# Patient Record
Sex: Male | Born: 1960
Health system: Southern US, Community
[De-identification: ages and names within clinical notes are randomized; demographics above are authoritative.]

## PROBLEM LIST (undated history)

## (undated) DIAGNOSIS — C4491 Basal cell carcinoma of skin, unspecified: Secondary | ICD-10-CM

## (undated) DIAGNOSIS — M1990 Unspecified osteoarthritis, unspecified site: Secondary | ICD-10-CM

## (undated) DIAGNOSIS — M7542 Impingement syndrome of left shoulder: Secondary | ICD-10-CM

## (undated) DIAGNOSIS — I447 Left bundle-branch block, unspecified: Secondary | ICD-10-CM

## (undated) DIAGNOSIS — S46019A Strain of muscle(s) and tendon(s) of the rotator cuff of unspecified shoulder, initial encounter: Secondary | ICD-10-CM

## (undated) HISTORY — PX: JOINT REPLACEMENT: SHX530

## (undated) HISTORY — PX: LASIK: SHX215

## (undated) HISTORY — PX: TONSILLECTOMY: SUR1361

## (undated) HISTORY — PX: WISDOM TOOTH EXTRACTION: SHX21

---

## 1990-03-17 HISTORY — PX: SHOULDER OPEN ROTATOR CUFF REPAIR: SHX2407

## 1999-08-08 ENCOUNTER — Encounter: Payer: Self-pay | Admitting: Emergency Medicine

## 1999-08-08 ENCOUNTER — Emergency Department (HOSPITAL_COMMUNITY): Admission: EM | Admit: 1999-08-08 | Discharge: 1999-08-08 | Payer: Self-pay | Admitting: Emergency Medicine

## 2001-08-16 ENCOUNTER — Ambulatory Visit (HOSPITAL_COMMUNITY): Admission: RE | Admit: 2001-08-16 | Discharge: 2001-08-16 | Payer: Self-pay | Admitting: Urology

## 2001-08-16 ENCOUNTER — Encounter: Payer: Self-pay | Admitting: Urology

## 2001-09-14 ENCOUNTER — Ambulatory Visit (HOSPITAL_BASED_OUTPATIENT_CLINIC_OR_DEPARTMENT_OTHER): Admission: RE | Admit: 2001-09-14 | Discharge: 2001-09-14 | Payer: Self-pay | Admitting: Urology

## 2001-09-14 HISTORY — PX: VARICOCELECTOMY: SHX1084

## 2002-02-24 ENCOUNTER — Encounter: Admission: RE | Admit: 2002-02-24 | Discharge: 2002-02-24 | Payer: Self-pay | Admitting: Internal Medicine

## 2002-02-24 ENCOUNTER — Encounter: Payer: Self-pay | Admitting: Internal Medicine

## 2003-03-29 ENCOUNTER — Ambulatory Visit (HOSPITAL_COMMUNITY): Admission: RE | Admit: 2003-03-29 | Discharge: 2003-03-29 | Payer: Self-pay | Admitting: General Surgery

## 2003-03-29 ENCOUNTER — Encounter (INDEPENDENT_AMBULATORY_CARE_PROVIDER_SITE_OTHER): Payer: Self-pay | Admitting: *Deleted

## 2003-03-29 ENCOUNTER — Ambulatory Visit (HOSPITAL_BASED_OUTPATIENT_CLINIC_OR_DEPARTMENT_OTHER): Admission: RE | Admit: 2003-03-29 | Discharge: 2003-03-29 | Payer: Self-pay | Admitting: General Surgery

## 2006-05-22 ENCOUNTER — Ambulatory Visit: Payer: Self-pay | Admitting: Family Medicine

## 2006-05-22 LAB — CONVERTED CEMR LAB
ALT: 18 units/L (ref 0–40)
AST: 22 units/L (ref 0–37)
Albumin: 3.7 g/dL (ref 3.5–5.2)
Alkaline Phosphatase: 67 units/L (ref 39–117)
BUN: 10 mg/dL (ref 6–23)
Basophils Absolute: 0 10*3/uL (ref 0.0–0.1)
Basophils Relative: 0.2 % (ref 0.0–1.0)
Bilirubin, Direct: 0.2 mg/dL (ref 0.0–0.3)
CO2: 28 meq/L (ref 19–32)
Calcium: 9.4 mg/dL (ref 8.4–10.5)
Chloride: 102 meq/L (ref 96–112)
Cholesterol: 134 mg/dL (ref 0–200)
Creatinine, Ser: 0.9 mg/dL (ref 0.4–1.5)
Eosinophils Absolute: 0 10*3/uL (ref 0.0–0.6)
Eosinophils Relative: 0.3 % (ref 0.0–5.0)
GFR calc Af Amer: 118 mL/min
GFR calc non Af Amer: 98 mL/min
Glucose, Bld: 104 mg/dL — ABNORMAL HIGH (ref 70–99)
HCT: 44.3 % (ref 39.0–52.0)
HDL: 42.1 mg/dL (ref >39.0)
Hemoglobin: 15.1 g/dL (ref 13.0–17.0)
LDL Cholesterol: 78 mg/dL (ref 0–99)
Lymphocytes Relative: 18 % (ref 12.0–46.0)
MCHC: 34 g/dL (ref 30.0–36.0)
MCV: 89.6 fL (ref 78.0–100.0)
Monocytes Absolute: 0.6 10*3/uL (ref 0.2–0.7)
Monocytes Relative: 7.1 % (ref 3.0–11.0)
Neutro Abs: 6.9 10*3/uL (ref 1.4–7.7)
Neutrophils Relative %: 74.4 % (ref 43.0–77.0)
Platelets: 236 10*3/uL (ref 150–400)
Potassium: 4.3 meq/L (ref 3.5–5.1)
RBC: 4.94 M/uL (ref 4.22–5.81)
RDW: 13.1 % (ref 11.5–14.6)
Sodium: 138 meq/L (ref 135–145)
TSH: 1.03 microintl units/mL (ref 0.35–5.50)
Total Bilirubin: 2.2 mg/dL — ABNORMAL HIGH (ref 0.3–1.2)
Total CHOL/HDL Ratio: 3.2
Total Protein: 6.7 g/dL (ref 6.0–8.3)
Triglycerides: 71 mg/dL (ref 0–149)
VLDL: 14 mg/dL (ref 0–40)
WBC: 9.1 10*3/uL (ref 4.5–10.5)

## 2006-05-29 ENCOUNTER — Ambulatory Visit: Payer: Self-pay | Admitting: Family Medicine

## 2006-05-29 LAB — CONVERTED CEMR LAB: PSA: 1.05 ng/mL (ref 0.10–4.00)

## 2006-10-13 ENCOUNTER — Ambulatory Visit: Payer: Self-pay | Admitting: Family Medicine

## 2006-10-13 DIAGNOSIS — S96819A Strain of other specified muscles and tendons at ankle and foot level, unspecified foot, initial encounter: Secondary | ICD-10-CM

## 2006-10-13 DIAGNOSIS — A630 Anogenital (venereal) warts: Secondary | ICD-10-CM | POA: Insufficient documentation

## 2006-10-13 DIAGNOSIS — S93499A Sprain of other ligament of unspecified ankle, initial encounter: Secondary | ICD-10-CM | POA: Insufficient documentation

## 2006-10-14 ENCOUNTER — Telehealth (INDEPENDENT_AMBULATORY_CARE_PROVIDER_SITE_OTHER): Payer: Self-pay | Admitting: *Deleted

## 2007-01-12 ENCOUNTER — Ambulatory Visit: Payer: Self-pay | Admitting: Family Medicine

## 2007-01-12 DIAGNOSIS — S335XXA Sprain of ligaments of lumbar spine, initial encounter: Secondary | ICD-10-CM | POA: Insufficient documentation

## 2007-01-12 DIAGNOSIS — G4700 Insomnia, unspecified: Secondary | ICD-10-CM | POA: Insufficient documentation

## 2007-01-12 DIAGNOSIS — L723 Sebaceous cyst: Secondary | ICD-10-CM | POA: Insufficient documentation

## 2007-01-12 DIAGNOSIS — IMO0002 Reserved for concepts with insufficient information to code with codable children: Secondary | ICD-10-CM | POA: Insufficient documentation

## 2007-01-21 ENCOUNTER — Telehealth: Payer: Self-pay | Admitting: Family Medicine

## 2007-03-01 ENCOUNTER — Telehealth: Payer: Self-pay | Admitting: Family Medicine

## 2007-03-02 ENCOUNTER — Ambulatory Visit: Payer: Self-pay | Admitting: Family Medicine

## 2007-03-03 ENCOUNTER — Encounter: Admission: RE | Admit: 2007-03-03 | Discharge: 2007-03-03 | Payer: Self-pay | Admitting: Family Medicine

## 2007-03-09 ENCOUNTER — Encounter: Payer: Self-pay | Admitting: Family Medicine

## 2007-04-20 ENCOUNTER — Encounter: Payer: Self-pay | Admitting: Family Medicine

## 2008-01-21 ENCOUNTER — Telehealth: Payer: Self-pay | Admitting: Family Medicine

## 2008-04-18 ENCOUNTER — Telehealth: Payer: Self-pay | Admitting: Family Medicine

## 2008-04-19 ENCOUNTER — Ambulatory Visit: Payer: Self-pay | Admitting: Family Medicine

## 2008-04-19 LAB — CONVERTED CEMR LAB
Bilirubin Urine: NEGATIVE
Blood in Urine, dipstick: NEGATIVE
Glucose, Urine, Semiquant: NEGATIVE
Ketones, urine, test strip: NEGATIVE
Nitrite: NEGATIVE
Protein, U semiquant: NEGATIVE
Specific Gravity, Urine: 1.02
Urobilinogen, UA: 0.2
WBC Urine, dipstick: NEGATIVE
pH: 7

## 2008-04-24 LAB — CONVERTED CEMR LAB
ALT: 23 units/L (ref 0–53)
AST: 27 units/L (ref 0–37)
Albumin: 4 g/dL (ref 3.5–5.2)
Alkaline Phosphatase: 75 units/L (ref 39–117)
BUN: 18 mg/dL (ref 6–23)
Basophils Absolute: 0 10*3/uL (ref 0.0–0.1)
Basophils Relative: 0 % (ref 0.0–3.0)
Bilirubin, Direct: 0.2 mg/dL (ref 0.0–0.3)
CO2: 30 meq/L (ref 19–32)
Calcium: 9.4 mg/dL (ref 8.4–10.5)
Chloride: 107 meq/L (ref 96–112)
Cholesterol: 162 mg/dL (ref 0–200)
Creatinine, Ser: 1 mg/dL (ref 0.4–1.5)
Eosinophils Absolute: 0 10*3/uL (ref 0.0–0.7)
Eosinophils Relative: 0.9 % (ref 0.0–5.0)
GFR calc Af Amer: 104 mL/min
GFR calc non Af Amer: 86 mL/min
Glucose, Bld: 110 mg/dL — ABNORMAL HIGH (ref 70–99)
HCT: 45.4 % (ref 39.0–52.0)
HDL: 42 mg/dL (ref >39.0)
Hemoglobin: 15.3 g/dL (ref 13.0–17.0)
LDL Cholesterol: 109 mg/dL — ABNORMAL HIGH (ref 0–99)
Lymphocytes Relative: 29.6 % (ref 12.0–46.0)
MCHC: 33.8 g/dL (ref 30.0–36.0)
MCV: 90.9 fL (ref 78.0–100.0)
Monocytes Absolute: 0.5 10*3/uL (ref 0.1–1.0)
Monocytes Relative: 9.6 % (ref 3.0–12.0)
Neutro Abs: 2.9 10*3/uL (ref 1.4–7.7)
Neutrophils Relative %: 59.9 % (ref 43.0–77.0)
Platelets: 202 10*3/uL (ref 150–400)
Potassium: 4.9 meq/L (ref 3.5–5.1)
RBC: 4.99 M/uL (ref 4.22–5.81)
RDW: 13.2 % (ref 11.5–14.6)
Sodium: 141 meq/L (ref 135–145)
TSH: 0.35 microintl units/mL (ref 0.35–5.50)
Total Bilirubin: 1.3 mg/dL — ABNORMAL HIGH (ref 0.3–1.2)
Total CHOL/HDL Ratio: 3.9
Total Protein: 6.7 g/dL (ref 6.0–8.3)
Triglycerides: 53 mg/dL (ref 0–149)
VLDL: 11 mg/dL (ref 0–40)
WBC: 4.9 10*3/uL (ref 4.5–10.5)

## 2008-04-26 ENCOUNTER — Ambulatory Visit: Payer: Self-pay | Admitting: Family Medicine

## 2008-04-26 DIAGNOSIS — M545 Low back pain, unspecified: Secondary | ICD-10-CM | POA: Insufficient documentation

## 2008-05-01 LAB — CONVERTED CEMR LAB: PSA: 0.68 ng/mL (ref 0.10–4.00)

## 2008-06-27 ENCOUNTER — Telehealth: Payer: Self-pay | Admitting: Family Medicine

## 2008-09-26 ENCOUNTER — Telehealth: Payer: Self-pay | Admitting: Family Medicine

## 2009-02-13 ENCOUNTER — Telehealth: Payer: Self-pay | Admitting: Family Medicine

## 2009-02-14 ENCOUNTER — Telehealth: Payer: Self-pay | Admitting: Family Medicine

## 2009-05-15 ENCOUNTER — Ambulatory Visit: Payer: Self-pay | Admitting: Family Medicine

## 2009-05-15 LAB — CONVERTED CEMR LAB
Bilirubin Urine: NEGATIVE
Blood in Urine, dipstick: NEGATIVE
Glucose, Urine, Semiquant: NEGATIVE
Ketones, urine, test strip: NEGATIVE
Nitrite: NEGATIVE
Protein, U semiquant: NEGATIVE
Specific Gravity, Urine: 1.015
Urobilinogen, UA: 0.2
WBC Urine, dipstick: NEGATIVE
pH: 5.5

## 2009-05-17 LAB — CONVERTED CEMR LAB
ALT: 20 units/L (ref 0–53)
AST: 24 units/L (ref 0–37)
Albumin: 4 g/dL (ref 3.5–5.2)
Alkaline Phosphatase: 62 units/L (ref 39–117)
Basophils Relative: 0.4 % (ref 0.0–3.0)
CO2: 29 meq/L (ref 19–32)
Calcium: 8.9 mg/dL (ref 8.4–10.5)
Chloride: 107 meq/L (ref 96–112)
Eosinophils Absolute: 0 10*3/uL (ref 0.0–0.7)
Eosinophils Relative: 1 % (ref 0.0–5.0)
HDL: 46.1 mg/dL (ref >39.00)
Hemoglobin: 13.9 g/dL (ref 13.0–17.0)
Lymphocytes Relative: 33 % (ref 12.0–46.0)
MCHC: 33.8 g/dL (ref 30.0–36.0)
Monocytes Relative: 9.7 % (ref 3.0–12.0)
Neutro Abs: 2.7 10*3/uL (ref 1.4–7.7)
RBC: 4.49 M/uL (ref 4.22–5.81)
Sodium: 139 meq/L (ref 135–145)
Total CHOL/HDL Ratio: 3
Total Protein: 6.5 g/dL (ref 6.0–8.3)
WBC: 4.8 10*3/uL (ref 4.5–10.5)

## 2009-05-30 ENCOUNTER — Ambulatory Visit: Payer: Self-pay | Admitting: Family Medicine

## 2009-05-30 DIAGNOSIS — N401 Enlarged prostate with lower urinary tract symptoms: Secondary | ICD-10-CM | POA: Insufficient documentation

## 2009-05-30 DIAGNOSIS — F3289 Other specified depressive episodes: Secondary | ICD-10-CM | POA: Insufficient documentation

## 2009-05-30 DIAGNOSIS — F329 Major depressive disorder, single episode, unspecified: Secondary | ICD-10-CM | POA: Insufficient documentation

## 2009-06-27 ENCOUNTER — Telehealth: Payer: Self-pay | Admitting: Family Medicine

## 2009-07-26 ENCOUNTER — Telehealth: Payer: Self-pay | Admitting: Family Medicine

## 2010-04-16 NOTE — Assessment & Plan Note (Signed)
Summary: CPX // RS---PT [facility] // RS   Vital Signs:  Patient profile:   50 year old male Weight:      183 pounds BMI:     24.91 Pulse rate:   64 / minute Pulse rhythm:   regular BP sitting:   118 / 84  (left arm) Cuff size:   regular  Vitals Entered By: Raechel Ache, RN (May 30, 2009 10:09 AM) CC: CPX, labs done.   History of Present Illness: 50 yr old male for a cpx. he feels fine physically, and he continues to work out 5 or 6 days a week. However he asks for help with some depression that has come up in the past few months. He describes feelings of sadness, lack of motivation, and fatigue which are unusual for him. Sleep and appetite are okay.   Preventive Screening-Counseling & Management  Alcohol-Tobacco     Smoking Status: never  Allergies (verified): No Known Drug Allergies  Past History:  Past Medical History: Reviewed history from 04/26/2008 and no changes required. Genital Warts Insomnia Low back pain, degenerative discs per MRI, saw Dr. Dutch Quint. Saw Dr. Murray Hodgkins for exercises and stretches  Past Surgical History: Tonsillectomy Bilateral varicocelectomy Lasik both eyes Lt shoulder reconstruction for torn rotator cuff  Family History: Reviewed history from 04/26/2008 and no changes required. Family History of Prostate CA 1st degree relative <50  Social History: Reviewed history from 04/26/2008 and no changes required. Married Never Smoked Alcohol use-yes Drug use-no  Review of Systems  The patient denies anorexia, fever, weight loss, weight gain, vision loss, decreased hearing, hoarseness, chest pain, syncope, dyspnea on exertion, peripheral edema, prolonged cough, headaches, hemoptysis, abdominal pain, melena, hematochezia, severe indigestion/heartburn, hematuria, incontinence, genital sores, muscle weakness, suspicious skin lesions, transient blindness, difficulty walking, unusual weight change, abnormal bleeding, enlarged lymph nodes, angioedema, breast  masses, and testicular masses.    Physical Exam  General:  Well-developed,well-nourished,in no acute distress; alert,appropriate and cooperative throughout examination Head:  Normocephalic and atraumatic without obvious abnormalities. No apparent alopecia or balding. Eyes:  No corneal or conjunctival inflammation noted. EOMI. Perrla. Funduscopic exam benign, without hemorrhages, exudates or papilledema. Vision grossly normal. Ears:  External ear exam shows no significant lesions or deformities.  Otoscopic examination reveals clear canals, tympanic membranes are intact bilaterally without bulging, retraction, inflammation or discharge. Hearing is grossly normal bilaterally. Nose:  External nasal examination shows no deformity or inflammation. Nasal mucosa are pink and moist without lesions or exudates. Mouth:  Oral mucosa and oropharynx without lesions or exudates.  Teeth in good repair. Neck:  No deformities, masses, or tenderness noted. Chest Wall:  No deformities, masses, tenderness or gynecomastia noted. Lungs:  Normal respiratory effort, chest expands symmetrically. Lungs are clear to auscultation, no crackles or wheezes. Heart:  Normal rate and regular rhythm. S1 and S2 normal without gallop, murmur, click, rub or other extra sounds. Abdomen:  Bowel sounds positive,abdomen soft and non-tender without masses, organomegaly or hernias noted. Rectal:  No external abnormalities noted. Normal sphincter tone. No rectal masses or tenderness. Genitalia:  Testes bilaterally descended without nodularity, tenderness or masses. No scrotal masses or lesions. No penis lesions or urethral discharge. Prostate:  no nodules, no asymmetry, no induration, and 1+ enlarged.   Msk:  No deformity or scoliosis noted of thoracic or lumbar spine.   Pulses:  R and L carotid,radial,femoral,dorsalis pedis and posterior tibial pulses are full and equal bilaterally Extremities:  No clubbing, cyanosis, edema, or deformity  noted with normal full  range of motion of all joints.   Neurologic:  No cranial nerve deficits noted. Station and gait are normal. Plantar reflexes are down-going bilaterally. DTRs are symmetrical throughout. Sensory, motor and coordinative functions appear intact. Skin:  Intact without suspicious lesions or rashes Cervical Nodes:  No lymphadenopathy noted Axillary Nodes:  No palpable lymphadenopathy Inguinal Nodes:  No significant adenopathy Psych:  Cognition and judgment appear intact. Alert and cooperative with normal attention span and concentration. No apparent delusions, illusions, hallucinations   Impression & Recommendations:  Problem # 1:  WELL ADULT EXAM (ICD-V70.0)  Problem # 2:  BENIGN PROSTATIC HYPERTROPHY, WITH OBSTRUCTION (ICD-600.01)  Orders: Venipuncture (16109) TLB-PSA (Prostate Specific Antigen) (84153-PSA)  Problem # 3:  DEPRESSION (ICD-311)  His updated medication list for this problem includes:    Lorazepam 1 Mg Tabs (Lorazepam) .Marland Kitchen... 1 by mouth every 6 hours as needed anxiety    Zoloft 50 Mg Tabs (Sertraline hcl) ..... Once daily  Complete Medication List: 1)  Vicoprofen 7.5-200 Mg Tabs (Hydrocodone-ibuprofen) .Marland Kitchen.. 1 by mouth every 6 hours as needed for pain 2)  Cyclobenzaprine Hcl 5 Mg Tabs (Cyclobenzaprine hcl) .... Three times a day as needed spasm 3)  Lorazepam 1 Mg Tabs (Lorazepam) .Marland Kitchen.. 1 by mouth every 6 hours as needed anxiety 4)  Zoloft 50 Mg Tabs (Sertraline hcl) .... Once daily  Patient Instructions: 1)  Start on Zoloft.  2)  Please schedule a follow-up appointment in 1 month.  Prescriptions: ZOLOFT 50 MG TABS (SERTRALINE HCL) once daily  #30 x 5   Entered and Authorized by:   Nelwyn Salisbury MD   Signed by:   Nelwyn Salisbury MD on 05/30/2009   Method used:   Electronically to        [facility]. [phone]* (retail)       [phone] [address].       [place], [place]  [address]       Ph: [phone]       Fax:  [phone]   RxID:   [id number]

## 2010-04-16 NOTE — Progress Notes (Signed)
Summary: change Rx  Phone Note Call from Patient Call back at Work Phone [phone]   Caller: Patient Summary of Call: says Rx for Zoloft is wrong- he taks 50 or 100 mg/daily and wants script to say 1 once daily of the 100's.  Initial call taken by: Raechel Ache, RN,  Jul 26, 2009 9:54 AM  Follow-up for Phone Call        change his rx to say 100 mg once daily , #30 with 11 rf Follow-up by: Nelwyn Salisbury MD,  Jul 26, 2009 11:23 AM    New/Updated Medications: ZOLOFT 100 MG TABS (SERTRALINE HCL) 1 once daily Prescriptions: ZOLOFT 100 MG TABS (SERTRALINE HCL) 1 once daily  #30 x 11   Entered by:   Raechel Ache, RN   Authorized by:   Nelwyn Salisbury MD   Signed by:   Raechel Ache, RN on 07/26/2009   Method used:   Electronically to        [facility]. [phone]* (retail)       [phone] [address].       [place], [place]  [address]       Ph: [phone]       Fax: [phone]   RxID:   [id number]

## 2010-04-16 NOTE — Progress Notes (Signed)
Summary: about med  Phone Note Call from Patient Call back at Work Phone [phone]   Caller: Patient Call For: Nelwyn Salisbury MD Complaint: Urinary/GYN Problems Summary of Call: Started on Zoloft last visit and calling to let you know he feels better. Asking for refill of 100mg  and he can break in half so it'll be less expensive? [facility] Initial call taken by: Raechel Ache, RN,  June 27, 2009 10:25 AM  Follow-up for Phone Call        of ocurse. Call in Zoloft 100mg  to take 1/2 tab a day, #30 with 11 rf Follow-up by: Nelwyn Salisbury MD,  June 27, 2009 1:12 PM    New/Updated Medications: ZOLOFT 100 MG TABS (SERTRALINE HCL) 1/2 once daily Prescriptions: ZOLOFT 100 MG TABS (SERTRALINE HCL) 1/2 once daily  #30 x 11   Entered by:   Raechel Ache, RN   Authorized by:   Nelwyn Salisbury MD   Signed by:   Raechel Ache, RN on 06/27/2009   Method used:   Electronically to        [facility]. [phone]* (retail)       [phone] [address].       [place], [place]  [address]       Ph: [phone]       Fax: [phone]   RxID:   [id number]

## 2010-06-11 ENCOUNTER — Other Ambulatory Visit: Payer: Self-pay

## 2010-06-11 NOTE — Telephone Encounter (Signed)
Pt called  (Voice Mail mess)  has cpx in May Says he has sporadic migraines and wants maxalt called in Per EMR med not on med list . pls advise/ Call to [facility]

## 2010-06-12 MED ORDER — RIZATRIPTAN BENZOATE 10 MG PO TABS: 10.0000 mg | ORAL_TABLET | ORAL | Status: DC | PRN

## 2010-06-12 NOTE — Telephone Encounter (Signed)
Call in Maxalt 10mg  to use prn, #12 with no rf

## 2010-06-12 NOTE — Telephone Encounter (Signed)
rx called in and left mess on his cell phone that rx was called in.

## 2010-07-16 ENCOUNTER — Other Ambulatory Visit (INDEPENDENT_AMBULATORY_CARE_PROVIDER_SITE_OTHER): Payer: BC Managed Care – PPO | Admitting: Family Medicine

## 2010-07-16 DIAGNOSIS — Z0000 Encounter for general adult medical examination without abnormal findings: Secondary | ICD-10-CM

## 2010-07-16 LAB — CBC WITH DIFFERENTIAL/PLATELET
Basophils Absolute: 0 10*3/uL (ref 0.0–0.1)
Eosinophils Absolute: 0 10*3/uL (ref 0.0–0.7)
Lymphocytes Relative: 34.3 % (ref 12.0–46.0)
MCHC: 34.2 g/dL (ref 30.0–36.0)
Monocytes Relative: 10.1 % (ref 3.0–12.0)
Platelets: 214 10*3/uL (ref 150.0–400.0)
RDW: 13.5 % (ref 11.5–14.6)

## 2010-07-16 LAB — BASIC METABOLIC PANEL
BUN: 21 mg/dL (ref 6–23)
CO2: 30 mEq/L (ref 19–32)
Calcium: 9.1 mg/dL (ref 8.4–10.5)
Creatinine, Ser: 1 mg/dL (ref 0.4–1.5)
GFR: 87.81 mL/min (ref >60.00)
Glucose, Bld: 93 mg/dL (ref 70–99)

## 2010-07-16 LAB — POCT URINALYSIS DIPSTICK
Bilirubin, UA: NEGATIVE
Blood, UA: NEGATIVE
Ketones, UA: NEGATIVE
Leukocytes, UA: NEGATIVE
Protein, UA: NEGATIVE
pH, UA: 6

## 2010-07-16 LAB — LIPID PANEL
Cholesterol: 146 mg/dL (ref 0–200)
HDL: 36.2 mg/dL — ABNORMAL LOW (ref >39.00)
LDL Cholesterol: 90 mg/dL (ref 0–99)
VLDL: 20 mg/dL (ref 0.0–40.0)

## 2010-07-16 LAB — TSH: TSH: 0.94 u[IU]/mL (ref 0.35–5.50)

## 2010-07-16 LAB — HEPATIC FUNCTION PANEL
AST: 23 U/L (ref 0–37)
Albumin: 3.9 g/dL (ref 3.5–5.2)
Alkaline Phosphatase: 61 U/L (ref 39–117)
Total Bilirubin: 1 mg/dL (ref 0.3–1.2)

## 2010-07-18 NOTE — Progress Notes (Signed)
Pt. Informed.

## 2010-07-19 ENCOUNTER — Encounter: Payer: Self-pay | Admitting: Family Medicine

## 2010-07-22 ENCOUNTER — Encounter: Payer: Self-pay | Admitting: Family Medicine

## 2010-07-22 ENCOUNTER — Ambulatory Visit (INDEPENDENT_AMBULATORY_CARE_PROVIDER_SITE_OTHER): Payer: BC Managed Care – PPO | Admitting: Family Medicine

## 2010-07-22 VITALS — BP 122/82 | HR 59 | Temp 97.8°F | Resp 14 | Ht 71.5 in | Wt 185.0 lb

## 2010-07-22 DIAGNOSIS — Z0000 Encounter for general adult medical examination without abnormal findings: Secondary | ICD-10-CM

## 2010-07-22 DIAGNOSIS — Z136 Encounter for screening for cardiovascular disorders: Secondary | ICD-10-CM

## 2010-07-22 MED ORDER — RIZATRIPTAN BENZOATE 10 MG PO TABS: 10.0000 mg | ORAL_TABLET | ORAL | Status: DC | PRN

## 2010-07-22 NOTE — Progress Notes (Signed)
  Subjective:    Patient ID: Ricky Freeman, male    DOB: [birth year], 50 y.o.   MRN: [id number]  HPI 50 yr old male for a cpx. He feels great and has no concerns. His back pain is stable and the Zoloft is working well.    Review of Systems  Constitutional: Negative.   HENT: Negative.   Eyes: Negative.   Respiratory: Negative.   Cardiovascular: Negative.   Gastrointestinal: Negative.   Genitourinary: Negative.   Musculoskeletal: Negative.   Skin: Negative.   Neurological: Negative.   Hematological: Negative.   Psychiatric/Behavioral: Negative.        Objective:   Physical Exam  Constitutional: He is oriented to person, place, and time. He appears well-developed and well-nourished. No distress.  HENT:  Head: Normocephalic and atraumatic.  Right Ear: External ear normal.  Left Ear: External ear normal.  Nose: Nose normal.  Mouth/Throat: Oropharynx is clear and moist. No oropharyngeal exudate.  Eyes: Conjunctivae and EOM are normal. Pupils are equal, round, and reactive to light. Right eye exhibits no discharge. Left eye exhibits no discharge. No scleral icterus.  Neck: Neck supple. No JVD present. No tracheal deviation present. No thyromegaly present.  Cardiovascular: Normal rate, regular rhythm, normal heart sounds and intact distal pulses.  Exam reveals no gallop and no friction rub.   No murmur heard.      EKG normal  Pulmonary/Chest: Effort normal and breath sounds normal. No respiratory distress. He has no wheezes. He has no rales. He exhibits no tenderness.  Abdominal: Soft. Bowel sounds are normal. He exhibits no distension and no mass. There is no tenderness. There is no rebound and no guarding.  Genitourinary: Rectum normal, prostate normal and penis normal. Guaiac negative stool. No penile tenderness.  Musculoskeletal: Normal range of motion. He exhibits no edema and no tenderness.  Lymphadenopathy:    He has no cervical adenopathy.  Neurological: He is alert and  oriented to person, place, and time. He has normal reflexes. No cranial nerve deficit. He exhibits normal muscle tone. Coordination normal.  Skin: Skin is warm and dry. No rash noted. He is not diaphoretic. No erythema. No pallor.  Psychiatric: He has a normal mood and affect. His behavior is normal. Judgment and thought content normal.          Assessment & Plan:  Doing well.

## 2010-08-02 NOTE — Op Note (Signed)
[facility]  Patient:    Ricky Freeman, Ricky Freeman Visit Number: [id number] MRN: [id number]          Service Type: NES Location: NESC Attending Physician:  Tania Ade Dictated by:   Lucrezia Starch. Ovidio Hanger, M.D. Proc. Date: 09/14/01 Admit Date:  09/14/2001 Discharge Date: 09/14/2001                             Operative Report  DIAGNOSES: 1. Bilateral varicoceles. 2. Subfertility.  OPERATIVE PROCEDURE:  Bilateral varicocelectomy.  SURGEON:  Lucrezia Starch. Ovidio Hanger, M.D.  ASSISTANT:  Crecencio Mc, Resident.  ANESTHESIA:  General laryngeal airway.  ESTIMATED BLOOD LOSS:  10 cc.  TUBES:  None.  COMPLICATIONS:  None.  INDICATION FOR PROCEDURE:  The patient is a very nice 50 year old white male who presented for a possible male factor infertility. They have been attempting to conceive and he was found to have a morphology defect and quality of motion defect on two distinct semen analyses. He subsequently on color-flow Doppler was found to have bilateral varicoceles, left greater than right, and after understanding risks, benefits, and alternatives, elected to proceed with bilateral varicocelectomy.  PROCEDURE IN DETAIL:  The patient was placed in the supine position. After proper general laryngeal airway anesthesia, he was placed in the dorsal lithotomy position and prepped and draped with Betadine in sterile fashion. A left inguinal incision was made. Sharp dissection carried down through the subcutaneous tissues. Scarpas was incised and dissected and the cord was lifted subinguinally and delivered into the operative field. External internal spermatic fascias were bluntly dissected with vascular forceps. One large varicosity was noted. It was ligated with 4-0 silk ligatures and several spidery varicosities were ligated also with 4-0 silk ligatures. The internal spermatic artery was identified and protected. The vas deferens and its accompanying  vessels along with the nerves were identified and protected. The cord was placed back into the subinguinal region and a similar incision was made on the right side. Similar dissection was performed. Several varicosities were present and again, the internal spermatic artery was difficult to find but was avoided and the varicosities ligated in a similar manner and the vas deferens and accompanying vessels were avoided. No external spermatic veins were noted on either side. Thorough irrigation was performed. Good hemostasis was noted to be present. Testicles were in the scrotal sac. The wounds were closed similarly with two 3-0 chromic catgut sutures in Scarpas fascia. The skin was closed with running 4-0 Vicryl subcuticular-type stitch, dressed with Benzoin and Steri-Strips, Gelfoam Toppers. The patient tolerated the procedure well. No complications. He was taken to the recovery room stable. Dictated by:   Lucrezia Starch. Ovidio Hanger, M.D. Attending Physician:  Tania Ade DD:  09/14/01 TD:  09/17/01 Job: [id number]

## 2010-08-02 NOTE — Assessment & Plan Note (Signed)
[facility]                            [facility] OFFICE NOTE   NAME:Ricky Freeman, Ricky Freeman                       MRN:          [id number]  DATE:05/29/2006                            DOB:          1960/07/07    This is a 50 year old gentleman here to establish with our practice.  He  is also for a complete physical examination.  He generally feels well  but does have several things to discuss.  He is an avid athlete.  He  plays a lot of softball and also works out in [employer] on a regular  basis.  He sees Dr. Hayden Rasmussen for orthopedic concerns.  For the last  year, he has had intermittent pain and swelling around the left elbow.  He is considering seeing Dr. Thomasena Edis about the elbow pain sometimes  soon.  He realizes that lifting weights aggravates it, but he is not  willing to stop.  Also he has been seeing Dr. Terri Piedra for dermatology  care.  He had a basal cell cancer on his right anterior chest treated  about a week or so ago, with what sounds like electrodesiccation and  curettage.  He is due to follow up with Dr. Terri Piedra 3 weeks from now.  Also, he and his wife have been going through infertility treatments  with Dr. Chevis Pretty.  His wife is 66 years old.  After the patient had  bilateral varicoceles removed, in 2003 they were able to conceive and  have one child who is now 9 years old.  They are considering having  another child at this time.   OTHER PAST MEDICAL HISTORY:  Include a tonsillectomy, bilateral  varicocelectomy, LASIK surgery on both eyes.  Also, Dr. Eulah Pont did a  left shoulder reconstruction for torn rotator cuffs, after a softball  injury in 1993.  He has been treated in the past for genital warts.   ALLERGIES:  NONE.   CURRENT MEDICATIONS:  None, except for multivitamin daily.   HABITS:  He drinks some alcohol.  He does not use tobacco.   SOCIAL HISTORY:  He works as an Manufacturing systems engineer.  He is married with  one child.  He had been  seeing Dr. Donneta Romberg for primary care,  until transferring to [place] now.   FAMILY HISTORY:  Remarkable for prostate cancer diagnosed in his father  in his late 7s.  He is now in his 27s and doing well.   REVIEW OF SYSTEMS:  Otherwise notable for constant pain and stiffness in  his neck.  He was diagnosed with a herniated disk in his neck several  years ago.  He uses Vicodin and Motrin intermittently for discomfort.  He does daily stretching exercises.  He uses ice packs as needed.  He  has a chiropractor intermittently in the past as well and reports that  manipulation therapy is often successful to give him temporary relief of  his pain.   OBJECTIVE:  VITAL SIGNS:  Height 6 foot 0 inches, weight 183, BP 112/70,  pulse 68 and regular.  GENERAL:  Appears to be healthy,  is quite muscular and athletic-  appearing.  SKIN:  Free of significant lesions, except for a scabbed area on his  right anterior chest, where Dr. Terri Piedra had done a procedure as above.  HEENT:  Eyes:  Sclerae is clear.  NECK:  Supple without lymphadenopathy or masses.  LUNGS:  Clear.  CARDIAC:  Rate and rhythm regular without gallops, murmurs, rubs.  Distal pulses are full.  ABDOMEN:  Soft, normal bowel sounds, nontender masses.  GENITALIA:  Normal male.  He is circumcised.  RECTAL:  No masses or tenderness.  Prostate is within normal limits.  Stool hemoccult negative.  EXTREMITIES:  No clubbing, cyanosis or edema.  NEUROLOGICAL:  Grossly intact.   LABORATORY:  He was here for fasting labs on March 7th.  These were all  within normal limits, although a PSA was not performed.   ASSESSMENT/PLAN:  1. Complete physical.  Will plan on repeating this on a yearly basis.  2. Family history of prostate cancer.  Will check a PSA level today.  3. Left elbow pain.  He will follow up with Dr. Thomasena Edis.  4. Basal cell carcinoma.  He will follow up with Dr. Terri Piedra.     Tera Mater. Clent Ridges, MD  Electronically Signed     SAF/MedQ  DD: 05/29/2006  DT: 05/30/2006  Job #: [id number]

## 2010-08-02 NOTE — Op Note (Signed)
Ricky Freeman, VALDEZ                          ACCOUNT NO.:  [id number]   MEDICAL RECORD NO.:  [id number]                   PATIENT TYPE:  AMB   LOCATION:  DSC                                  FACILITY:  [facility]   PHYSICIAN:  Ollen Gross. Vernell Morgans, M.D.              DATE OF BIRTH:  07/02/1960   DATE OF PROCEDURE:  03/29/2003  DATE OF DISCHARGE:                                 OPERATIVE REPORT   PREOPERATIVE DIAGNOSIS:  Basal cell carcinoma of the right chest.   POSTOPERATIVE DIAGNOSIS:  Basal cell carcinoma of the right chest.   PROCEDURE:  Excision of 1 cm basal cell carcinoma of the right chest.   SURGEON:  Ollen Gross. Carolynne Edouard, M.D.   ANESTHESIA:  Local.   DESCRIPTION OF PROCEDURE:  After informed consent was obtained the patient  was brought to the operating room and placed in the supine position on the  operating table. The area on his chest was prepped with Betadine  and draped  in the usual sterile manner. The area was then infiltrated with 1% Lidocaine  with epinephrine to obtain a good field block.   Once this was accomplished the area approximately 1 cm in diameter was cut  out in an elliptical fashion sharply with a #15 blade  knife. The incision  was full thickness  down to the fat. Once  the specimen had been removed  from the patient it was sent to pathology for further evaluation.   The wound was then closed with interrupted 5-0 Monocryl stitches and a  Dermabond dressing was applied. The patient tolerated the procedure well.  All sponge, instrument and needle counts were correct at the end of the  case. The patient was awakened and taken to the recovery room in stable  condition.                                               Ollen Gross. Vernell Morgans, M.D.    PST/MEDQ  D:  03/30/2003  T:  03/30/2003  Job:  [id number]

## 2010-09-19 ENCOUNTER — Other Ambulatory Visit: Payer: Self-pay | Admitting: Family Medicine

## 2010-09-19 MED ORDER — HYDROCODONE-IBUPROFEN 7.5-200 MG PO TABS: 1.0000 | ORAL_TABLET | Freq: Four times a day (QID) | ORAL | Status: DC | PRN

## 2010-09-19 NOTE — Telephone Encounter (Signed)
Call in #60 with 5 rf 

## 2010-09-19 NOTE — Telephone Encounter (Signed)
rx called in

## 2010-09-19 NOTE — Telephone Encounter (Signed)
LOV 07/22/10

## 2010-10-24 ENCOUNTER — Other Ambulatory Visit: Payer: Self-pay | Admitting: Family Medicine

## 2010-10-25 NOTE — Telephone Encounter (Signed)
rx request for zoloft. Pt last seen 07/22/10 for annual exam.  Pls advise.

## 2010-12-30 ENCOUNTER — Other Ambulatory Visit: Payer: Self-pay | Admitting: Family Medicine

## 2010-12-31 NOTE — Telephone Encounter (Signed)
Please check with the patient. Has he really used #60 with 5 rf in just 3 months?

## 2011-01-02 ENCOUNTER — Telehealth: Payer: Self-pay | Admitting: Family Medicine

## 2011-01-02 NOTE — Telephone Encounter (Signed)
Pt is taking that amount.

## 2011-01-02 NOTE — Telephone Encounter (Signed)
Call in #60 with 5 rf 

## 2011-01-02 NOTE — Telephone Encounter (Signed)
Refill request for Hydrocodone-Ibuprofen 7.5-200 mg take 1 po q6hrs prn and pt last here on 07/22/10.

## 2011-01-02 NOTE — Telephone Encounter (Signed)
I called in script and left voice message for pt to return my call.

## 2011-01-03 NOTE — Telephone Encounter (Signed)
Already done

## 2011-05-06 ENCOUNTER — Other Ambulatory Visit: Payer: Self-pay | Admitting: Family Medicine

## 2011-06-02 ENCOUNTER — Other Ambulatory Visit: Payer: Self-pay | Admitting: Family Medicine

## 2011-06-03 NOTE — Telephone Encounter (Signed)
Call in #60 with 2 rf 

## 2011-06-16 ENCOUNTER — Other Ambulatory Visit: Payer: Self-pay | Admitting: Family Medicine

## 2011-06-17 NOTE — Telephone Encounter (Signed)
Pt was last seen here on 07/22/10.

## 2011-06-19 NOTE — Telephone Encounter (Signed)
Dr fry is out of office . Can rx 30#  In the meantime . Further refills per Dr Clent Ridges.

## 2011-06-20 ENCOUNTER — Telehealth: Payer: Self-pay | Admitting: Family Medicine

## 2011-06-20 NOTE — Telephone Encounter (Signed)
I spoke with pt and he declined to have script called in for # 30 only. I had already put the order in computer but not called it in yet. Pt aware that you are out of the office until next week. He needs a refill on Vicoprofen 7.5-200 mg take 1 po q6hrs prn and usually gets # 60.

## 2011-06-22 NOTE — Telephone Encounter (Signed)
Call in #60 with 2 rf 

## 2011-06-23 MED ORDER — HYDROCODONE-IBUPROFEN 7.5-200 MG PO TABS: 1.0000 | ORAL_TABLET | Freq: Four times a day (QID) | ORAL | Status: DC | PRN

## 2011-06-23 NOTE — Telephone Encounter (Signed)
I called in script and left a voice message for pt. 

## 2011-07-11 ENCOUNTER — Other Ambulatory Visit (INDEPENDENT_AMBULATORY_CARE_PROVIDER_SITE_OTHER): Payer: BC Managed Care – PPO

## 2011-07-11 DIAGNOSIS — Z0000 Encounter for general adult medical examination without abnormal findings: Secondary | ICD-10-CM

## 2011-07-11 LAB — POCT URINALYSIS DIPSTICK
Ketones, UA: NEGATIVE
Leukocytes, UA: NEGATIVE
Protein, UA: NEGATIVE
Urobilinogen, UA: 0.2
pH, UA: 6

## 2011-07-11 LAB — CBC WITH DIFFERENTIAL/PLATELET
Basophils Absolute: 0 10*3/uL (ref 0.0–0.1)
Eosinophils Relative: 1 % (ref 0.0–5.0)
HCT: 41.4 % (ref 39.0–52.0)
Lymphocytes Relative: 34 % (ref 12.0–46.0)
Lymphs Abs: 1.2 10*3/uL (ref 0.7–4.0)
Monocytes Relative: 9.3 % (ref 3.0–12.0)
Platelets: 174 10*3/uL (ref 150.0–400.0)
RDW: 14.5 % (ref 11.5–14.6)
WBC: 3.7 10*3/uL — ABNORMAL LOW (ref 4.5–10.5)

## 2011-07-11 LAB — TSH: TSH: 0.75 u[IU]/mL (ref 0.35–5.50)

## 2011-07-11 LAB — BASIC METABOLIC PANEL
BUN: 16 mg/dL (ref 6–23)
Calcium: 8.8 mg/dL (ref 8.4–10.5)
GFR: 92.96 mL/min (ref >60.00)
Potassium: 4.2 mEq/L (ref 3.5–5.1)
Sodium: 140 mEq/L (ref 135–145)

## 2011-07-11 LAB — LIPID PANEL
HDL: 45.1 mg/dL (ref >39.00)
LDL Cholesterol: 82 mg/dL (ref 0–99)
Total CHOL/HDL Ratio: 3
VLDL: 18.6 mg/dL (ref 0.0–40.0)

## 2011-07-11 LAB — HEPATIC FUNCTION PANEL
ALT: 20 U/L (ref 0–53)
AST: 21 U/L (ref 0–37)
Alkaline Phosphatase: 60 U/L (ref 39–117)
Bilirubin, Direct: 0 mg/dL (ref 0.0–0.3)
Total Bilirubin: 0.9 mg/dL (ref 0.3–1.2)

## 2011-07-14 NOTE — Progress Notes (Signed)
Quick Note:  Left voice message with normal results. ______ 

## 2011-07-23 ENCOUNTER — Encounter: Payer: Self-pay | Admitting: Family Medicine

## 2011-07-23 ENCOUNTER — Ambulatory Visit (INDEPENDENT_AMBULATORY_CARE_PROVIDER_SITE_OTHER): Payer: BC Managed Care – PPO | Admitting: Family Medicine

## 2011-07-23 VITALS — BP 120/74 | HR 77 | Temp 98.8°F | Ht 71.5 in | Wt 186.0 lb

## 2011-07-23 DIAGNOSIS — Z0000 Encounter for general adult medical examination without abnormal findings: Secondary | ICD-10-CM

## 2011-07-23 DIAGNOSIS — Z23 Encounter for immunization: Secondary | ICD-10-CM

## 2011-07-23 MED ORDER — ELETRIPTAN HYDROBROMIDE 40 MG PO TABS: 40.0000 mg | ORAL_TABLET | ORAL | Status: DC | PRN

## 2011-07-23 NOTE — Progress Notes (Signed)
Addended by: Aniceto Boss A on: 07/23/2011 02:55 PM   Modules accepted: Orders

## 2011-07-23 NOTE — Progress Notes (Signed)
  Subjective:    Patient ID: Ricky Freeman, male    DOB: September 29, 1960, 51 y.o.   MRN: [id number]  HPI 51 yr old male for a cpx. He feels fine except for his chronic low back pain and migraines. He does not get relief with generic Maxalt like he did with name brand, but his insurance company will not pay for the name brand.    Review of Systems  Constitutional: Negative.   HENT: Negative.   Eyes: Negative.   Respiratory: Negative.   Cardiovascular: Negative.   Gastrointestinal: Negative.   Genitourinary: Negative.   Musculoskeletal: Negative.   Skin: Negative.   Neurological: Negative.   Hematological: Negative.   Psychiatric/Behavioral: Negative.        Objective:   Physical Exam  Constitutional: He is oriented to person, place, and time. He appears well-developed and well-nourished. No distress.  HENT:  Head: Normocephalic and atraumatic.  Right Ear: External ear normal.  Left Ear: External ear normal.  Nose: Nose normal.  Mouth/Throat: Oropharynx is clear and moist. No oropharyngeal exudate.  Eyes: Conjunctivae and EOM are normal. Pupils are equal, round, and reactive to light. Right eye exhibits no discharge. Left eye exhibits no discharge. No scleral icterus.  Neck: Neck supple. No JVD present. No tracheal deviation present. No thyromegaly present.  Cardiovascular: Normal rate, regular rhythm, normal heart sounds and intact distal pulses.  Exam reveals no gallop and no friction rub.   No murmur heard. Pulmonary/Chest: Effort normal and breath sounds normal. No respiratory distress. He has no wheezes. He has no rales. He exhibits no tenderness.  Abdominal: Soft. Bowel sounds are normal. He exhibits no distension and no mass. There is no tenderness. There is no rebound and no guarding.  Genitourinary: Rectum normal, prostate normal and penis normal. Guaiac negative stool. No penile tenderness.  Musculoskeletal: Normal range of motion. He exhibits no edema and no tenderness.    Lymphadenopathy:    He has no cervical adenopathy.  Neurological: He is alert and oriented to person, place, and time. He has normal reflexes. No cranial nerve deficit. He exhibits normal muscle tone. Coordination normal.  Skin: Skin is warm and dry. No rash noted. He is not diaphoretic. No erythema. No pallor.  Psychiatric: He has a normal mood and affect. His behavior is normal. Judgment and thought content normal.          Assessment & Plan:  Well exam. Try Relpax for the migraines.

## 2011-08-16 ENCOUNTER — Other Ambulatory Visit: Payer: Self-pay | Admitting: Family Medicine

## 2011-09-11 ENCOUNTER — Other Ambulatory Visit: Payer: Self-pay | Admitting: Family Medicine

## 2011-09-12 NOTE — Telephone Encounter (Signed)
Call in #60 with 5 rf 

## 2011-10-27 ENCOUNTER — Other Ambulatory Visit: Payer: Self-pay | Admitting: Family Medicine

## 2011-10-27 NOTE — Telephone Encounter (Signed)
Last seen 07/23/11 cpx Last written 06/02/11 # 60 2Rf Please advise

## 2011-10-29 NOTE — Telephone Encounter (Signed)
Call in #60 with 5 rf 

## 2011-10-30 NOTE — Telephone Encounter (Signed)
Last seen 5/8/3 cpx Last written 06/02/11 # 60 2RF Please advise

## 2011-10-31 NOTE — Telephone Encounter (Signed)
Please call in #60 with 5 rf

## 2011-10-31 NOTE — Telephone Encounter (Signed)
Called in.

## 2012-03-15 ENCOUNTER — Other Ambulatory Visit: Payer: Self-pay | Admitting: Family Medicine

## 2012-03-19 NOTE — Telephone Encounter (Signed)
Call in #60 with 5 rf 

## 2012-05-27 ENCOUNTER — Other Ambulatory Visit: Payer: Self-pay | Admitting: Family Medicine

## 2012-05-28 ENCOUNTER — Telehealth: Payer: Self-pay | Admitting: Family Medicine

## 2012-05-28 NOTE — Telephone Encounter (Signed)
Pt left a voice message, he does have a CPE with you on 07/26/12 and wanted to know if he can get a refill on Lorazepam to last until the visit?

## 2012-05-31 MED ORDER — LORAZEPAM 1 MG PO TABS
1.0000 mg | ORAL_TABLET | Freq: Four times a day (QID) | ORAL | Status: DC | PRN
Start: 2012-05-31 — End: 2013-05-06

## 2012-05-31 NOTE — Telephone Encounter (Signed)
Okay to refill for 3 months 

## 2012-05-31 NOTE — Telephone Encounter (Signed)
I called in script and spoke with pt. 

## 2012-07-15 ENCOUNTER — Other Ambulatory Visit (INDEPENDENT_AMBULATORY_CARE_PROVIDER_SITE_OTHER): Payer: 59

## 2012-07-15 DIAGNOSIS — Z0000 Encounter for general adult medical examination without abnormal findings: Secondary | ICD-10-CM

## 2012-07-15 DIAGNOSIS — N401 Enlarged prostate with lower urinary tract symptoms: Secondary | ICD-10-CM

## 2012-07-15 DIAGNOSIS — F329 Major depressive disorder, single episode, unspecified: Secondary | ICD-10-CM

## 2012-07-15 DIAGNOSIS — F3289 Other specified depressive episodes: Secondary | ICD-10-CM

## 2012-07-15 LAB — CBC WITH DIFFERENTIAL/PLATELET
Basophils Absolute: 0 10*3/uL (ref 0.0–0.1)
Eosinophils Absolute: 0 10*3/uL (ref 0.0–0.7)
HCT: 40.5 % (ref 39.0–52.0)
Hemoglobin: 13.9 g/dL (ref 13.0–17.0)
Lymphs Abs: 1.9 10*3/uL (ref 0.7–4.0)
MCHC: 34.5 g/dL (ref 30.0–36.0)
MCV: 86.8 fl (ref 78.0–100.0)
Monocytes Absolute: 0.5 10*3/uL (ref 0.1–1.0)
Neutro Abs: 2.9 10*3/uL (ref 1.4–7.7)
RDW: 13.5 % (ref 11.5–14.6)

## 2012-07-15 LAB — POCT URINALYSIS DIPSTICK
Bilirubin, UA: NEGATIVE
Blood, UA: NEGATIVE
Glucose, UA: NEGATIVE
Leukocytes, UA: NEGATIVE
Nitrite, UA: NEGATIVE

## 2012-07-15 LAB — BASIC METABOLIC PANEL
CO2: 26 mEq/L (ref 19–32)
Calcium: 8.7 mg/dL (ref 8.4–10.5)
Creatinine, Ser: 1.1 mg/dL (ref 0.4–1.5)

## 2012-07-15 LAB — HEPATIC FUNCTION PANEL
ALT: 22 U/L (ref 0–53)
Albumin: 3.9 g/dL (ref 3.5–5.2)
Total Bilirubin: 1.8 mg/dL — ABNORMAL HIGH (ref 0.3–1.2)
Total Protein: 6.5 g/dL (ref 6.0–8.3)

## 2012-07-15 LAB — LIPID PANEL
Total CHOL/HDL Ratio: 3
Triglycerides: 61 mg/dL (ref 0.0–149.0)

## 2012-07-19 ENCOUNTER — Other Ambulatory Visit: Payer: BC Managed Care – PPO

## 2012-07-19 NOTE — Progress Notes (Signed)
Quick Note:  Called pt and left detailed voicemail on personalized voicemail. ______

## 2012-07-26 ENCOUNTER — Encounter: Payer: Self-pay | Admitting: Family Medicine

## 2012-07-26 ENCOUNTER — Ambulatory Visit (INDEPENDENT_AMBULATORY_CARE_PROVIDER_SITE_OTHER): Payer: 59 | Admitting: Family Medicine

## 2012-07-26 VITALS — BP 108/70 | HR 97 | Temp 98.1°F | Ht 71.0 in | Wt 182.0 lb

## 2012-07-26 DIAGNOSIS — Z0000 Encounter for general adult medical examination without abnormal findings: Secondary | ICD-10-CM

## 2012-07-26 NOTE — Progress Notes (Signed)
  Subjective:    Patient ID: Ricky Freeman, male    DOB: 1960/05/03, 52 y.o.   MRN: [id number]  HPI 52 yr old male for a cpx. He is doing well with no complaints. He still has chronic low back pain but he is managing it well. He still lifts weights and rides his bike 40-50 miles a week.    Review of Systems  Constitutional: Negative.   HENT: Negative.   Eyes: Negative.   Respiratory: Negative.   Cardiovascular: Negative.   Gastrointestinal: Negative.   Genitourinary: Negative.   Musculoskeletal: Negative.   Skin: Negative.   Neurological: Negative.   Psychiatric/Behavioral: Negative.        Objective:   Physical Exam  Constitutional: He is oriented to person, place, and time. He appears well-developed and well-nourished. No distress.  HENT:  Head: Normocephalic and atraumatic.  Right Ear: External ear normal.  Left Ear: External ear normal.  Nose: Nose normal.  Mouth/Throat: Oropharynx is clear and moist. No oropharyngeal exudate.  Eyes: Conjunctivae and EOM are normal. Pupils are equal, round, and reactive to light. Right eye exhibits no discharge. Left eye exhibits no discharge. No scleral icterus.  Neck: Neck supple. No JVD present. No tracheal deviation present. No thyromegaly present.  Cardiovascular: Normal rate, regular rhythm, normal heart sounds and intact distal pulses.  Exam reveals no gallop and no friction rub.   No murmur heard. Pulmonary/Chest: Effort normal and breath sounds normal. No respiratory distress. He has no wheezes. He has no rales. He exhibits no tenderness.  Abdominal: Soft. Bowel sounds are normal. He exhibits no distension and no mass. There is no tenderness. There is no rebound and no guarding.  Genitourinary: Rectum normal, prostate normal and penis normal. Guaiac negative stool. No penile tenderness.  Musculoskeletal: Normal range of motion. He exhibits no edema and no tenderness.  Lymphadenopathy:    He has no cervical adenopathy.   Neurological: He is alert and oriented to person, place, and time. He has normal reflexes. No cranial nerve deficit. He exhibits normal muscle tone. Coordination normal.  Skin: Skin is warm and dry. No rash noted. He is not diaphoretic. No erythema. No pallor.  Psychiatric: He has a normal mood and affect. His behavior is normal. Judgment and thought content normal.          Assessment & Plan:  Well exam.

## 2012-07-26 NOTE — Addendum Note (Signed)
Addended by: Gershon Crane A on: 07/26/2012 02:25 PM   Modules accepted: Orders

## 2012-07-29 ENCOUNTER — Encounter: Payer: Self-pay | Admitting: Internal Medicine

## 2012-09-13 ENCOUNTER — Other Ambulatory Visit: Payer: Self-pay | Admitting: Family Medicine

## 2012-09-14 NOTE — Telephone Encounter (Signed)
Call in #60 with 5 rf 

## 2012-09-16 ENCOUNTER — Encounter: Payer: Self-pay | Admitting: Internal Medicine

## 2012-09-16 ENCOUNTER — Ambulatory Visit (AMBULATORY_SURGERY_CENTER): Payer: 59 | Admitting: *Deleted

## 2012-09-16 VITALS — Ht 71.0 in | Wt 189.8 lb

## 2012-09-16 DIAGNOSIS — Z1211 Encounter for screening for malignant neoplasm of colon: Secondary | ICD-10-CM

## 2012-09-16 MED ORDER — MOVIPREP 100 G PO SOLR: 1.0000 | Freq: Once | ORAL | Status: DC

## 2012-09-16 NOTE — Progress Notes (Signed)
Denies allergies to eggs or soy products. Denies difficulty with sedation or general anesthesia.

## 2012-09-30 ENCOUNTER — Encounter: Payer: Self-pay | Admitting: Internal Medicine

## 2012-09-30 ENCOUNTER — Ambulatory Visit (AMBULATORY_SURGERY_CENTER): Payer: 59 | Admitting: Internal Medicine

## 2012-09-30 VITALS — BP 115/71 | HR 47 | Temp 97.5°F | Resp 11 | Ht 71.0 in | Wt 189.0 lb

## 2012-09-30 DIAGNOSIS — Z1211 Encounter for screening for malignant neoplasm of colon: Secondary | ICD-10-CM

## 2012-09-30 DIAGNOSIS — D126 Benign neoplasm of colon, unspecified: Secondary | ICD-10-CM

## 2012-09-30 MED ORDER — SODIUM CHLORIDE 0.9 % IV SOLN: 500.0000 mL | INTRAVENOUS | Status: DC

## 2012-09-30 NOTE — Op Note (Signed)
[facility] [address] [place], [address]   COLONOSCOPY PROCEDURE REPORT  PATIENT: Ricky, Freeman  MR#: [id number] BIRTHDATE: 12/14/1960 , 52  yrs. old GENDER: Male ENDOSCOPIST: Beverley Fiedler, MD REFERRED XB:[id number] Clent Ridges, M.D. PROCEDURE DATE:  09/30/2012 PROCEDURE:   Colonoscopy with snare polypectomy ASA CLASS:   Class I INDICATIONS:average risk screening and first colonoscopy. MEDICATIONS: MAC sedation, administered by CRNA and propofol (Diprivan) 250mg  IV  DESCRIPTION OF PROCEDURE:   After the risks benefits and alternatives of the procedure were thoroughly explained, informed consent was obtained.  A digital rectal exam revealed no rectal mass.   The LB [id number]  endoscope was introduced through the anus and advanced to the cecum, which was identified by both the appendix and ileocecal valve. No adverse events experienced. The quality of the prep was good, using MoviPrep  The instrument was then slowly withdrawn as the colon was fully examined.   COLON FINDINGS: Four sessile polyps measuring 4-6 mm in size two with mucous caps were found in the ascending colon and proximal transverse colon.  Polypectomy was performed using cold snare.  All resections were complete and all polyp tissue was completely retrieved.   The colon mucosa was otherwise normal.  Retroflexed views revealed no abnormalities. The time to cecum=3 minutes 00 seconds.  Withdrawal time=12 minutes 00 seconds.  The scope was withdrawn and the procedure completed. COMPLICATIONS: There were no complications.  ENDOSCOPIC IMPRESSION: 1.   Four sessile polyps measuring 4-6 mm in size were found in the ascending colon and proximal transverse colon; Polypectomy was performed using cold snare 2.   The colon mucosa was otherwise normal  RECOMMENDATIONS: 1.  Await pathology results 2.  If the polyps removed today are proven to be adenomatous (pre-cancerous) polyps, you will need a  colonoscopy in 3 years. Otherwise you should continue to follow colorectal cancer screening guidelines for "routine risk" patients with a colonoscopy in 10 years.  You will receive a letter within 1-2 weeks with the results of your biopsy as well as final recommendations.  Please call my office if you have not received a letter after 3 weeks.   eSigned:  Beverley Fiedler, MD 09/30/2012 9:27 AM   cc: The Patient and Nelwyn Salisbury, MD

## 2012-09-30 NOTE — Patient Instructions (Addendum)

## 2012-09-30 NOTE — Progress Notes (Signed)
Called to room to assist during endoscopic procedure.  Patient ID and intended procedure confirmed with present staff. Received instructions for my participation in the procedure from the performing physician.  

## 2012-09-30 NOTE — Progress Notes (Signed)
Lidocaine-40mg IV prior to Propofol InductionPropofol given over incremental dosages 

## 2012-09-30 NOTE — Progress Notes (Signed)
Patient did not experience any of the following events: a burn prior to discharge; a fall within the facility; wrong site/side/patient/procedure/implant event; or a hospital transfer or hospital admission upon discharge from the facility. (G8907) Patient did not have preoperative order for IV antibiotic SSI prophylaxis. (G8918)  

## 2012-10-01 ENCOUNTER — Telehealth: Payer: Self-pay | Admitting: *Deleted

## 2012-10-01 NOTE — Telephone Encounter (Signed)
  Follow up Call-  Call back number 09/30/2012  Post procedure Call Back phone  # [phone]  Permission to leave phone message Yes     Patient questions:  Person on voice mail was not the patient.   Did not leave message.  Questionable HIPPA.

## 2012-10-05 ENCOUNTER — Encounter: Payer: Self-pay | Admitting: Internal Medicine

## 2012-12-29 ENCOUNTER — Telehealth: Payer: Self-pay

## 2012-12-29 NOTE — Telephone Encounter (Signed)
Rx request for HYDROcodone-ibuprofen (VICOPROFEN) 7.5-200 MG per tablet.  Pt last seen 5.12.2014. Pls advise.

## 2012-12-30 MED ORDER — HYDROCODONE-IBUPROFEN 7.5-200 MG PO TABS: ORAL_TABLET | ORAL | Status: DC

## 2012-12-30 NOTE — Telephone Encounter (Signed)
Done

## 2012-12-30 NOTE — Telephone Encounter (Signed)
Script is ready for pick up and I spoke with pt.  

## 2013-02-14 ENCOUNTER — Other Ambulatory Visit: Payer: Self-pay | Admitting: Family Medicine

## 2013-03-17 DIAGNOSIS — I447 Left bundle-branch block, unspecified: Secondary | ICD-10-CM

## 2013-03-17 HISTORY — DX: Left bundle-branch block, unspecified: I44.7

## 2013-04-04 ENCOUNTER — Telehealth: Payer: Self-pay | Admitting: Family Medicine

## 2013-04-04 MED ORDER — HYDROCODONE-IBUPROFEN 7.5-200 MG PO TABS: ORAL_TABLET | ORAL | Status: DC

## 2013-04-04 NOTE — Telephone Encounter (Signed)
Refill request for Vicoprofen. 

## 2013-04-04 NOTE — Telephone Encounter (Signed)
Done for one month only, he needs testing and a contract

## 2013-04-05 NOTE — Telephone Encounter (Signed)
Script is ready for pick up, contract printed and I spoke with pt. 

## 2013-04-27 ENCOUNTER — Encounter: Payer: Self-pay | Admitting: Family Medicine

## 2013-05-04 ENCOUNTER — Telehealth: Payer: Self-pay | Admitting: Family Medicine

## 2013-05-04 MED ORDER — HYDROCODONE-IBUPROFEN 7.5-200 MG PO TABS: ORAL_TABLET | ORAL | Status: DC

## 2013-05-04 NOTE — Telephone Encounter (Signed)
Called and spoke with pt and pt is aware.  

## 2013-05-04 NOTE — Telephone Encounter (Signed)
Pt left a voice message, need refill on Vicoprofen. He wants to know if you can increase the quantity? He would like to pick up more than a 1 month supply at a time.

## 2013-05-04 NOTE — Telephone Encounter (Signed)
Done for 3 months

## 2013-05-06 ENCOUNTER — Other Ambulatory Visit: Payer: Self-pay | Admitting: Family Medicine

## 2013-05-06 NOTE — Telephone Encounter (Signed)
Call in #60 with 2 rf 

## 2013-07-09 ENCOUNTER — Other Ambulatory Visit: Payer: Self-pay | Admitting: Family Medicine

## 2013-07-26 ENCOUNTER — Other Ambulatory Visit (INDEPENDENT_AMBULATORY_CARE_PROVIDER_SITE_OTHER): Payer: 59

## 2013-07-26 DIAGNOSIS — Z0000 Encounter for general adult medical examination without abnormal findings: Secondary | ICD-10-CM

## 2013-07-26 LAB — CBC WITH DIFFERENTIAL/PLATELET
BASOS ABS: 0 10*3/uL (ref 0.0–0.1)
Basophils Relative: 0.4 % (ref 0.0–3.0)
Eosinophils Absolute: 0.1 10*3/uL (ref 0.0–0.7)
Eosinophils Relative: 0.9 % (ref 0.0–5.0)
HCT: 43.5 % (ref 39.0–52.0)
Hemoglobin: 14.8 g/dL (ref 13.0–17.0)
LYMPHS ABS: 1.7 10*3/uL (ref 0.7–4.0)
Lymphocytes Relative: 29.6 % (ref 12.0–46.0)
MCHC: 34 g/dL (ref 30.0–36.0)
MCV: 89.9 fl (ref 78.0–100.0)
Monocytes Absolute: 0.5 10*3/uL (ref 0.1–1.0)
Monocytes Relative: 9.2 % (ref 3.0–12.0)
Neutro Abs: 3.5 10*3/uL (ref 1.4–7.7)
Neutrophils Relative %: 59.9 % (ref 43.0–77.0)
PLATELETS: 215 10*3/uL (ref 150.0–400.0)
RBC: 4.83 Mil/uL (ref 4.22–5.81)
RDW: 14 % (ref 11.5–15.5)
WBC: 5.8 10*3/uL (ref 4.0–10.5)

## 2013-07-26 LAB — POCT URINALYSIS DIPSTICK
Bilirubin, UA: NEGATIVE
Glucose, UA: NEGATIVE
KETONES UA: NEGATIVE
LEUKOCYTES UA: NEGATIVE
Nitrite, UA: NEGATIVE
PH UA: 6.5
PROTEIN UA: NEGATIVE
RBC UA: NEGATIVE
Spec Grav, UA: <=1.005
Urobilinogen, UA: 0.2

## 2013-07-26 LAB — HEPATIC FUNCTION PANEL
ALT: 32 U/L (ref 0–53)
AST: 28 U/L (ref 0–37)
Albumin: 4.3 g/dL (ref 3.5–5.2)
Alkaline Phosphatase: 67 U/L (ref 39–117)
Bilirubin, Direct: 0.2 mg/dL (ref 0.0–0.3)
TOTAL PROTEIN: 6.7 g/dL (ref 6.0–8.3)
Total Bilirubin: 1.2 mg/dL (ref 0.2–1.2)

## 2013-07-26 LAB — BASIC METABOLIC PANEL
BUN: 17 mg/dL (ref 6–23)
CALCIUM: 9.4 mg/dL (ref 8.4–10.5)
CO2: 29 meq/L (ref 19–32)
Chloride: 103 mEq/L (ref 96–112)
Creatinine, Ser: 1.1 mg/dL (ref 0.4–1.5)
GFR: 71.98 mL/min (ref >60.00)
Glucose, Bld: 89 mg/dL (ref 70–99)
Potassium: 4.9 mEq/L (ref 3.5–5.1)
SODIUM: 139 meq/L (ref 135–145)

## 2013-07-26 LAB — LIPID PANEL
CHOL/HDL RATIO: 4
Cholesterol: 152 mg/dL (ref 0–200)
HDL: 42.5 mg/dL (ref >39.00)
LDL Cholesterol: 94 mg/dL (ref 0–99)
TRIGLYCERIDES: 80 mg/dL (ref 0.0–149.0)
VLDL: 16 mg/dL (ref 0.0–40.0)

## 2013-07-26 LAB — PSA: PSA: 2.45 ng/mL (ref 0.10–4.00)

## 2013-07-26 LAB — TSH: TSH: 0.91 u[IU]/mL (ref 0.35–4.50)

## 2013-08-02 ENCOUNTER — Encounter: Payer: Self-pay | Admitting: Family Medicine

## 2013-08-02 ENCOUNTER — Ambulatory Visit (INDEPENDENT_AMBULATORY_CARE_PROVIDER_SITE_OTHER): Payer: 59 | Admitting: Family Medicine

## 2013-08-02 VITALS — BP 127/82 | HR 68 | Temp 98.8°F | Ht 71.25 in | Wt 181.0 lb

## 2013-08-02 DIAGNOSIS — R9431 Abnormal electrocardiogram [ECG] [EKG]: Secondary | ICD-10-CM

## 2013-08-02 DIAGNOSIS — R972 Elevated prostate specific antigen [PSA]: Secondary | ICD-10-CM

## 2013-08-02 DIAGNOSIS — Z0000 Encounter for general adult medical examination without abnormal findings: Secondary | ICD-10-CM

## 2013-08-02 MED ORDER — HYDROCODONE-IBUPROFEN 7.5-200 MG PO TABS: ORAL_TABLET | ORAL | Status: DC

## 2013-08-02 NOTE — Progress Notes (Signed)
Pre visit review using our clinic review tool, if applicable. No additional management support is needed unless otherwise documented below in the visit note. 

## 2013-08-03 ENCOUNTER — Encounter: Payer: Self-pay | Admitting: Family Medicine

## 2013-08-03 ENCOUNTER — Ambulatory Visit (INDEPENDENT_AMBULATORY_CARE_PROVIDER_SITE_OTHER): Payer: 59 | Admitting: Cardiology

## 2013-08-03 ENCOUNTER — Encounter: Payer: Self-pay | Admitting: Cardiology

## 2013-08-03 ENCOUNTER — Other Ambulatory Visit: Payer: Self-pay | Admitting: *Deleted

## 2013-08-03 VITALS — BP 132/88 | HR 68 | Ht 71.25 in | Wt 184.0 lb

## 2013-08-03 DIAGNOSIS — I447 Left bundle-branch block, unspecified: Secondary | ICD-10-CM | POA: Insufficient documentation

## 2013-08-03 DIAGNOSIS — R9431 Abnormal electrocardiogram [ECG] [EKG]: Secondary | ICD-10-CM

## 2013-08-03 NOTE — Progress Notes (Signed)
   Subjective:    Patient ID: Ricky Freeman, male    DOB: 04/26/1960, 53 y.o.   MRN: [id number]  HPI 53 yr old male for a cpx. He feels well.    Review of Systems  Constitutional: Negative.   HENT: Negative.   Eyes: Negative.   Respiratory: Negative.   Cardiovascular: Negative.   Gastrointestinal: Negative.   Genitourinary: Negative.   Musculoskeletal: Negative.   Skin: Negative.   Neurological: Negative.   Psychiatric/Behavioral: Negative.        Objective:   Physical Exam  Constitutional: He is oriented to person, place, and time. He appears well-developed and well-nourished. No distress.  HENT:  Head: Normocephalic and atraumatic.  Right Ear: External ear normal.  Left Ear: External ear normal.  Nose: Nose normal.  Mouth/Throat: Oropharynx is clear and moist. No oropharyngeal exudate.  Eyes: Conjunctivae and EOM are normal. Pupils are equal, round, and reactive to light. Right eye exhibits no discharge. Left eye exhibits no discharge. No scleral icterus.  Neck: Neck supple. No JVD present. No tracheal deviation present. No thyromegaly present.  Cardiovascular: Normal rate, regular rhythm, normal heart sounds and intact distal pulses.  Exam reveals no gallop and no friction rub.   No murmur heard. EKG shows new LBBB  Pulmonary/Chest: Effort normal and breath sounds normal. No respiratory distress. He has no wheezes. He has no rales. He exhibits no tenderness.  Abdominal: Soft. Bowel sounds are normal. He exhibits no distension and no mass. There is no tenderness. There is no rebound and no guarding.  Genitourinary: Rectum normal, prostate normal and penis normal. Guaiac negative stool. No penile tenderness.  Musculoskeletal: Normal range of motion. He exhibits no edema and no tenderness.  Lymphadenopathy:    He has no cervical adenopathy.  Neurological: He is alert and oriented to person, place, and time. He has normal reflexes. No cranial nerve deficit. He exhibits  normal muscle tone. Coordination normal.  Skin: Skin is warm and dry. No rash noted. He is not diaphoretic. No erythema. No pallor.  Psychiatric: He has a normal mood and affect. His behavior is normal. Judgment and thought content normal.          Assessment & Plan:  Well exam. Refer to cardiology for new LBBB. Since his PSA has increased fairly rapidly we will check another PSA in 6 months.

## 2013-08-03 NOTE — Patient Instructions (Signed)
Your physician recommends that you continue on your current medications as directed. Please refer to the Current Medication list given to you today.  Your physician has requested that you have an echocardiogram. Echocardiography is a painless test that uses sound waves to create images of your heart. It provides your doctor with information about the size and shape of your heart and how well your heart's chambers and valves are working. This procedure takes approximately one hour. There are no restrictions for this procedure.  Your physician has requested that you have a lexiscan myoview. For further information please visit [email or web address]. Please follow instruction sheet, as given.  Your physician recommends that you schedule a follow-up appointment As Needed with Dr Radford Pax

## 2013-08-03 NOTE — Progress Notes (Signed)
[facility], [place], [place]  [address] Phone: [phone] Fax:  [phone]  Date:  08/03/2013   ID:  Ricky Freeman, DOB July 05, 1960, MRN [id number]  PCP:  Laurey Morale, MD  Cardiologist:  Fransico Him, MD     History of Present Illness: Ricky Freeman is a 53 y.o. male with no prior cardiac history presents today for evaluation of abnormal EKG.  He was recently seen for a routine PE and was found to have a new LBBB.  He denies any chest pain, SOB, DOE, LE edema, dizziness, palpitations or syncope.  He lifts weights and does cardio 4-5 times weekly and bikes every weekend up to 50 miles.     Wt Readings from Last 3 Encounters:  08/03/13 184 lb (83.462 kg)  08/02/13 181 lb (82.101 kg)  09/30/12 189 lb (85.73 kg)     Past Medical History  Diagnosis Date  . Genital warts   . Insomnia   . Low back pain     degenerative discs per MRI, saw Dr. Trenton Gammon, Saw Dr. Brien Few for ece exercises and stretches   . Migraine syndrome     Current Outpatient Prescriptions  Medication Sig Dispense Refill  . Cyanocobalamin (VITAMIN B-12 CR PO) Take 1 each by mouth daily.        . cyclobenzaprine (FLEXERIL) 5 MG tablet TAKE 1 TABLET BY MOUTH THREE TIMES A DAY IF NEEDED FOR SPASM  60 tablet  3  . HYDROcodone-ibuprofen (VICOPROFEN) 7.5-200 MG per tablet take 1 tablet by mouth every 6 hours if needed  90 tablet  0  . ibuprofen (ADVIL,MOTRIN) 200 MG tablet Take 200 mg by mouth every 6 (six) hours as needed for pain.      Marland Kitchen LORazepam (ATIVAN) 1 MG tablet take 1 tablet by mouth every 6 hours if needed  60 tablet  2  . Multiple Vitamins-Minerals (MULTIVITAMIN,TX-MINERALS) tablet Take 1 tablet by mouth daily.        . rizatriptan (MAXALT) 10 MG tablet take 1 tablet by mouth if needed for migraines may repeat in 2 hours if needed  12 tablet  0   No current facility-administered medications for this visit.    Allergies:   No Known Allergies  Social History:  The patient  reports that he has  never smoked. He has never used smokeless tobacco. He reports that he drinks alcohol. He reports that he does not use illicit drugs.   Family History:  The patient's family history includes Prostate cancer in his father. There is no history of Colon cancer, Esophageal cancer, Rectal cancer, or Stomach cancer.   ROS:  Please see the history of present illness.      All other systems reviewed and negative.   PHYSICAL EXAM: VS:  BP 132/88  Pulse 68  Ht 5' 11.25" (1.81 m)  Wt 184 lb (83.462 kg)  BMI 25.48 kg/m2 Well nourished, well developed, in no acute distress HEENT: normal Neck: no JVD Cardiac:  normal S1, S2; RRR; no murmur Lungs:  clear to auscultation bilaterally, no wheezing, rhonchi or rales Abd: soft, nontender, no hepatomegaly Ext: no edema Skin: warm and dry Neuro:  CNs 2-12 intact, no focal abnormalities noted  EKG:  NSR with LBBB     ASSESSMENT AND PLAN:  1. New LBBB with no symptoms.  He is very active and exercise daily without any problems.   - I will check a 2D echo to assess for structural heart disease - Lexiscan  myoview to rule out ischemia  Followup with me PRN pending the results of the study  Signed, Fransico Him, MD 08/03/2013 11:50 AM

## 2013-08-18 ENCOUNTER — Ambulatory Visit (HOSPITAL_BASED_OUTPATIENT_CLINIC_OR_DEPARTMENT_OTHER): Payer: 59 | Admitting: Radiology

## 2013-08-18 ENCOUNTER — Other Ambulatory Visit: Payer: Self-pay

## 2013-08-18 ENCOUNTER — Ambulatory Visit (HOSPITAL_COMMUNITY): Payer: 59 | Attending: Cardiology | Admitting: Radiology

## 2013-08-18 VITALS — BP 129/87 | HR 48 | Ht 71.0 in | Wt 181.0 lb

## 2013-08-18 DIAGNOSIS — I447 Left bundle-branch block, unspecified: Secondary | ICD-10-CM | POA: Insufficient documentation

## 2013-08-18 DIAGNOSIS — R9431 Abnormal electrocardiogram [ECG] [EKG]: Secondary | ICD-10-CM

## 2013-08-18 MED ORDER — TECHNETIUM TC 99M SESTAMIBI GENERIC - CARDIOLITE
30.0000 | Freq: Once | INTRAVENOUS | Status: AC | PRN
  Administered 2013-08-18: 30 via INTRAVENOUS

## 2013-08-18 MED ORDER — ADENOSINE (DIAGNOSTIC) 3 MG/ML IV SOLN
0.5600 mg/kg | Freq: Once | INTRAVENOUS | Status: AC
  Administered 2013-08-18: 45.9 mg via INTRAVENOUS

## 2013-08-18 MED ORDER — TECHNETIUM TC 99M SESTAMIBI GENERIC - CARDIOLITE
10.0000 | Freq: Once | INTRAVENOUS | Status: AC | PRN
  Administered 2013-08-18: 10 via INTRAVENOUS

## 2013-08-18 NOTE — Progress Notes (Signed)
[facility] [address] [place], [place] [address] [phone]    Cardiology Nuclear Med Study  MCKADE GURKA is a 53 y.o. male     MRN : [id number]     DOB: 1960/11/04  Procedure Date: 08/18/2013  Nuclear Med Background Indication for Stress Test:  Evaluation for Ischemia and Abnormal EKG (New) LBBB History:  No known CAD, Echo 08/2013 (pending) Cardiac Risk Factors: None  Symptoms:  None indicated   Nuclear Pre-Procedure Caffeine/Decaff Intake:  None> 12 hrs NPO After: 8:30pm   Lungs:  clear O2 Sat: 99% on room air. IV 0.9% NS with Angio Cath:  22g  IV Site: R Antecubital x 1, tolerated well IV Started by:  Irven Baltimore, RN  Chest Size (in):  46 Cup Size: n/a  Height: 5\' 11"  (1.803 m)  Weight:  181 lb (82.101 kg)  BMI:  Body mass index is 25.26 kg/(m^2). Tech Comments:  N/A    Nuclear Med Study 1 or 2 day study: 1 day  Stress Test Type:  Adenosine  Reading MD: N/A  Order Authorizing Provider:  Fransico Him, MD  Resting Radionuclide: Technetium 20m Sestamibi  Resting Radionuclide Dose: 11.0 mCi   Stress Radionuclide:  Technetium 64m Sestamibi  Stress Radionuclide Dose: 33.0 mCi           Stress Protocol Rest HR: 48 Stress HR: 95  Rest BP: 129/87 Stress BP: 129/74  Exercise Time (min): n/a METS: n/a   Predicted Max HR: 169 bpm % Max HR: 56.21 bpm Rate Pressure Product: 12730   Dose of Adenosine (mg):  46.1 mg Dose of Lexiscan: n/a mg  Dose of Atropine (mg): n/a Dose of Dobutamine: n/a mcg/kg/min (at max HR)  Stress Test Technologist: Glade Lloyd, BS-ES  Nuclear Technologist:  Charlton Amor, CNMT     Rest Procedure:  Myocardial perfusion imaging was performed at rest 45 minutes following the intravenous administration of Technetium 90m Sestamibi. Rest ECG: Sinus bradycardia, LBBB  Stress Procedure:  The patient received IV adenosine at 140 mcg/kg/min for 4 minutes.  Technetium 51m Sestamibi was injected at the 2 minute mark  and quantitative spect images were obtained after a 45 minute delay.  During the infusion of Adenosine the patient complained of lightheadedness.  This completely resolved in recovery.  Stress ECG: No significant change from baseline ECG  QPS Raw Data Images:  There is interference from nuclear activity from structures below the diaphragm. This does not affect the ability to read the study. Stress Images:  There is a perfusion defect in the entire inferior and basal and mid inferolateral walls.  Rest Images:  There is a perfusion defect in the entire inferior and basal and mid inferolateral walls.  Subtraction (SDS):  A large non-reversible, severe defect in the entire inferior and basal and mid inferoseptal walls with no reversible component. (SDS 0). Transient Ischemic Dilatation (Normal <1.22):  1.03 Lung/Heart Ratio (Normal <0.45):  0.29  Quantitative Gated Spect Images QGS EDV:  169 ml QGS ESV:  100 ml  Impression Exercise Capacity:  Adenosine study with no exercise. BP Response:  Normal blood pressure response. Clinical Symptoms:  Lightheadedness. ECG Impression:  Baseline:  LBBB.  EKG uninterpretable due to LBBB at rest and stress. Comparison with Prior Nuclear Study: No images to compare  Overall Impression:  Intermediate risk stress nuclear study with a large scar in the RCA territory, no ischemia. .  LV Ejection Fraction: 41%.  LV Wall Motion:  Paradoxical  septal motion and hypokinesis of the entire inferior, basal and mid inferoseptal walls.   Ricky Freeman 08/18/2013

## 2013-08-18 NOTE — Progress Notes (Signed)
Echocardiogram performed.  

## 2013-08-30 ENCOUNTER — Other Ambulatory Visit: Payer: Self-pay | Admitting: General Surgery

## 2013-08-30 ENCOUNTER — Telehealth: Payer: Self-pay | Admitting: Cardiology

## 2013-08-30 DIAGNOSIS — R9439 Abnormal result of other cardiovascular function study: Secondary | ICD-10-CM

## 2013-08-30 NOTE — Telephone Encounter (Signed)
New message ° ° ° ° ° °Returning Danielle's call °

## 2013-08-30 NOTE — Telephone Encounter (Signed)
Gave pt results and ordered Test.

## 2013-09-05 ENCOUNTER — Encounter: Payer: Self-pay | Admitting: Cardiology

## 2013-09-12 ENCOUNTER — Ambulatory Visit (HOSPITAL_COMMUNITY)
Admission: RE | Admit: 2013-09-12 | Discharge: 2013-09-12 | Disposition: A | Discharge status: Home / Self Care | Payer: 59 | Source: Ambulatory Visit | Attending: Cardiology | Admitting: Cardiology

## 2013-09-12 DIAGNOSIS — R9439 Abnormal result of other cardiovascular function study: Secondary | ICD-10-CM | POA: Insufficient documentation

## 2013-09-12 MED ORDER — IOHEXOL 350 MG/ML SOLN
100.0000 mL | Freq: Once | INTRAVENOUS | Status: AC | PRN
  Administered 2013-09-12: 80 mL via INTRAVENOUS

## 2013-09-12 MED ORDER — METOPROLOL TARTRATE 1 MG/ML IV SOLN
5.0000 mg | Freq: Once | INTRAVENOUS | Status: AC
  Administered 2013-09-12: 5 mg via INTRAVENOUS
  Filled 2013-09-12: qty 5

## 2013-09-12 MED ORDER — METOPROLOL TARTRATE 1 MG/ML IV SOLN
INTRAVENOUS | Status: AC
  Filled 2013-09-12: qty 5

## 2013-09-12 MED ORDER — NITROGLYCERIN 0.4 MG SL SUBL
0.4000 mg | SUBLINGUAL_TABLET | SUBLINGUAL | Status: DC | PRN
  Filled 2013-09-12: qty 25

## 2013-09-12 MED ORDER — NITROGLYCERIN 0.4 MG SL SUBL
SUBLINGUAL_TABLET | SUBLINGUAL | Status: AC
  Administered 2013-09-12: 0.4 mg
  Filled 2013-09-12: qty 1

## 2013-09-13 ENCOUNTER — Telehealth: Payer: Self-pay | Admitting: Cardiology

## 2013-09-13 NOTE — Telephone Encounter (Signed)
Follow up:     Pt called for the results of his CT test, please give him a call back.

## 2013-09-14 ENCOUNTER — Telehealth: Payer: Self-pay | Admitting: Cardiology

## 2013-09-14 NOTE — Telephone Encounter (Signed)
New Message:  Pt called to get test results... Per Andee Poles. I informed pt that Dr. Radford Pax has the results. However, She is still in the processing of reviewing them.... Pt states he wants a call back stating when he can expect the results to be reviewed. He isalso states he is tired of waiting.Marland KitchenMarland Kitchen

## 2013-09-14 NOTE — Telephone Encounter (Signed)
Made pt aware I have not yet received the results on this study. I will call once I recieve

## 2013-09-14 NOTE — Telephone Encounter (Signed)
To Dr Radford Pax to make aware. Please advise on what I should tell pt. He had this done on the 29th and he is not happy about how long of a wait it has been.

## 2013-09-14 NOTE — Telephone Encounter (Signed)
Calcium score is 0 and there is no obvious plaque or obstructive lesion in his coronary arteries

## 2013-09-15 NOTE — Telephone Encounter (Signed)
Pt notified. Pt would like to know if he needs annual f/u or just prn.

## 2013-09-15 NOTE — Telephone Encounter (Signed)
Pt is aware and Recall put in for one year with Dr Radford Pax

## 2013-09-15 NOTE — Telephone Encounter (Signed)
--   follow up in 1 year

## 2013-09-15 NOTE — Telephone Encounter (Signed)
lmtrc

## 2013-11-07 ENCOUNTER — Telehealth: Payer: Self-pay | Admitting: Family Medicine

## 2013-11-07 NOTE — Telephone Encounter (Signed)
Refill request for Vicoprofen. 

## 2013-11-09 ENCOUNTER — Other Ambulatory Visit: Payer: Self-pay | Admitting: Family Medicine

## 2013-11-09 MED ORDER — HYDROCODONE-IBUPROFEN 7.5-200 MG PO TABS: ORAL_TABLET | ORAL | Status: DC

## 2013-11-09 NOTE — Telephone Encounter (Signed)
Script is ready for pick up and I spoke with pt.  

## 2013-11-09 NOTE — Telephone Encounter (Signed)
done

## 2014-01-26 ENCOUNTER — Other Ambulatory Visit (INDEPENDENT_AMBULATORY_CARE_PROVIDER_SITE_OTHER): Payer: 59

## 2014-01-26 DIAGNOSIS — R972 Elevated prostate specific antigen [PSA]: Secondary | ICD-10-CM

## 2014-01-26 LAB — PSA: PSA: 1.36 ng/mL (ref 0.10–4.00)

## 2014-02-21 ENCOUNTER — Telehealth: Payer: Self-pay | Admitting: Family Medicine

## 2014-02-21 MED ORDER — HYDROCODONE-IBUPROFEN 7.5-200 MG PO TABS: ORAL_TABLET | ORAL | Status: DC

## 2014-02-21 NOTE — Telephone Encounter (Signed)
done

## 2014-02-21 NOTE — Telephone Encounter (Signed)
Refill request for Vicoprofen. 

## 2014-02-22 NOTE — Telephone Encounter (Signed)
Scripts are ready for pick up and I left a voice message for pt. 

## 2014-04-17 ENCOUNTER — Other Ambulatory Visit: Payer: Self-pay | Admitting: Family Medicine

## 2014-04-18 NOTE — Telephone Encounter (Signed)
Call in #60 with 2 rf 

## 2014-05-29 ENCOUNTER — Telehealth: Payer: Self-pay | Admitting: Family Medicine

## 2014-05-29 NOTE — Telephone Encounter (Signed)
Refill request for Vicoprofen.

## 2014-06-01 MED ORDER — HYDROCODONE-IBUPROFEN 7.5-200 MG PO TABS: ORAL_TABLET | ORAL | Status: DC

## 2014-06-01 NOTE — Telephone Encounter (Signed)
done

## 2014-06-19 ENCOUNTER — Telehealth: Payer: Self-pay | Admitting: Family Medicine

## 2014-06-19 NOTE — Telephone Encounter (Signed)
Pt left a voice message, he has poison ivy and would like a refill on triamcinolone cream & prednisone. He has a upcoming visit, however would like to get scripts. [facility] )

## 2014-06-20 MED ORDER — TRIAMCINOLONE ACETONIDE 0.1 % EX CREA: 1.0000 "application " | TOPICAL_CREAM | Freq: Two times a day (BID) | CUTANEOUS | Status: DC | PRN

## 2014-06-20 NOTE — Telephone Encounter (Signed)
Call in Triamcinolone 0.1 % cream to apply bid prn rash, 30 gm with 2 rf. We cannot call in oral steroids without an OV

## 2014-06-20 NOTE — Telephone Encounter (Signed)
I sent script e-scribe and left a voice message with below information.  

## 2014-07-04 ENCOUNTER — Encounter: Payer: Self-pay | Admitting: Cardiology

## 2014-07-28 ENCOUNTER — Other Ambulatory Visit (INDEPENDENT_AMBULATORY_CARE_PROVIDER_SITE_OTHER): Payer: 59

## 2014-07-28 DIAGNOSIS — Z0000 Encounter for general adult medical examination without abnormal findings: Secondary | ICD-10-CM | POA: Diagnosis not present

## 2014-07-28 LAB — CBC WITH DIFFERENTIAL/PLATELET
BASOS PCT: 0.9 % (ref 0.0–3.0)
Basophils Absolute: 0 10*3/uL (ref 0.0–0.1)
Eosinophils Absolute: 0 10*3/uL (ref 0.0–0.7)
Eosinophils Relative: 1.3 % (ref 0.0–5.0)
HCT: 42.5 % (ref 39.0–52.0)
Hemoglobin: 14.8 g/dL (ref 13.0–17.0)
Lymphocytes Relative: 39.1 % (ref 12.0–46.0)
Lymphs Abs: 1.5 10*3/uL (ref 0.7–4.0)
MCHC: 34.7 g/dL (ref 30.0–36.0)
MCV: 87.1 fl (ref 78.0–100.0)
MONO ABS: 0.4 10*3/uL (ref 0.1–1.0)
Monocytes Relative: 10.9 % (ref 3.0–12.0)
Neutro Abs: 1.8 10*3/uL (ref 1.4–7.7)
Neutrophils Relative %: 47.8 % (ref 43.0–77.0)
PLATELETS: 229 10*3/uL (ref 150.0–400.0)
RBC: 4.87 Mil/uL (ref 4.22–5.81)
RDW: 13.7 % (ref 11.5–15.5)
WBC: 3.8 10*3/uL — AB (ref 4.0–10.5)

## 2014-07-28 LAB — BASIC METABOLIC PANEL
BUN: 15 mg/dL (ref 6–23)
CO2: 27 mEq/L (ref 19–32)
CREATININE: 1.34 mg/dL (ref 0.40–1.50)
Calcium: 9 mg/dL (ref 8.4–10.5)
Chloride: 103 mEq/L (ref 96–112)
GFR: 59.5 mL/min — ABNORMAL LOW (ref >60.00)
Glucose, Bld: 106 mg/dL — ABNORMAL HIGH (ref 70–99)
Potassium: 4.3 mEq/L (ref 3.5–5.1)
Sodium: 136 mEq/L (ref 135–145)

## 2014-07-28 LAB — LIPID PANEL
CHOLESTEROL: 140 mg/dL (ref 0–200)
HDL: 48.8 mg/dL (ref >39.00)
LDL CALC: 82 mg/dL (ref 0–99)
NONHDL: 91.2
Total CHOL/HDL Ratio: 3
Triglycerides: 45 mg/dL (ref 0.0–149.0)
VLDL: 9 mg/dL (ref 0.0–40.0)

## 2014-07-28 LAB — POCT URINALYSIS DIPSTICK
Bilirubin, UA: NEGATIVE
Blood, UA: NEGATIVE
GLUCOSE UA: NEGATIVE
KETONES UA: NEGATIVE
LEUKOCYTES UA: NEGATIVE
NITRITE UA: NEGATIVE
Protein, UA: NEGATIVE
Spec Grav, UA: 1.015
Urobilinogen, UA: 0.2
pH, UA: 6.5

## 2014-07-28 LAB — HEPATIC FUNCTION PANEL
ALT: 22 U/L (ref 0–53)
AST: 21 U/L (ref 0–37)
Albumin: 3.9 g/dL (ref 3.5–5.2)
Alkaline Phosphatase: 70 U/L (ref 39–117)
Bilirubin, Direct: 0.2 mg/dL (ref 0.0–0.3)
TOTAL PROTEIN: 6.4 g/dL (ref 6.0–8.3)
Total Bilirubin: 1 mg/dL (ref 0.2–1.2)

## 2014-07-28 LAB — TSH: TSH: 1.08 u[IU]/mL (ref 0.35–4.50)

## 2014-07-28 LAB — PSA: PSA: 1.39 ng/mL (ref 0.10–4.00)

## 2014-08-07 ENCOUNTER — Encounter: Payer: Self-pay | Admitting: Family Medicine

## 2014-08-07 ENCOUNTER — Ambulatory Visit (INDEPENDENT_AMBULATORY_CARE_PROVIDER_SITE_OTHER): Payer: 59 | Admitting: Family Medicine

## 2014-08-07 VITALS — BP 119/58 | HR 58 | Temp 98.6°F | Ht 71.0 in | Wt 187.0 lb

## 2014-08-07 DIAGNOSIS — Z0000 Encounter for general adult medical examination without abnormal findings: Secondary | ICD-10-CM

## 2014-08-07 MED ORDER — HYDROCODONE-IBUPROFEN 7.5-200 MG PO TABS: ORAL_TABLET | ORAL | Status: DC

## 2014-08-07 NOTE — Progress Notes (Signed)
   Subjective:    Patient ID: Ricky Freeman, male    DOB: 1960/06/28, 54 y.o.   MRN: [id number]  HPI 54 yr old male for a cpx. He feels well.    Review of Systems  Constitutional: Negative.   HENT: Negative.   Eyes: Negative.   Respiratory: Negative.   Cardiovascular: Negative.   Gastrointestinal: Negative.   Genitourinary: Negative.   Musculoskeletal: Negative.   Skin: Negative.   Neurological: Negative.   Psychiatric/Behavioral: Negative.        Objective:   Physical Exam  Constitutional: He is oriented to person, place, and time. He appears well-developed and well-nourished. No distress.  HENT:  Head: Normocephalic and atraumatic.  Right Ear: External ear normal.  Left Ear: External ear normal.  Nose: Nose normal.  Mouth/Throat: Oropharynx is clear and moist. No oropharyngeal exudate.  Eyes: Conjunctivae and EOM are normal. Pupils are equal, round, and reactive to light. Right eye exhibits no discharge. Left eye exhibits no discharge. No scleral icterus.  Neck: Neck supple. No JVD present. No tracheal deviation present. No thyromegaly present.  Cardiovascular: Normal rate, regular rhythm, normal heart sounds and intact distal pulses.  Exam reveals no gallop and no friction rub.   No murmur heard. EKG shows his baseline LBBB  Pulmonary/Chest: Effort normal and breath sounds normal. No respiratory distress. He has no wheezes. He has no rales. He exhibits no tenderness.  Abdominal: Soft. Bowel sounds are normal. He exhibits no distension and no mass. There is no tenderness. There is no rebound and no guarding.  Genitourinary: Rectum normal, prostate normal and penis normal. Guaiac negative stool. No penile tenderness.  Musculoskeletal: Normal range of motion. He exhibits no edema or tenderness.  Lymphadenopathy:    He has no cervical adenopathy.  Neurological: He is alert and oriented to person, place, and time. He has normal reflexes. No cranial nerve deficit. He exhibits  normal muscle tone. Coordination normal.  Skin: Skin is warm and dry. No rash noted. He is not diaphoretic. No erythema. No pallor.  Psychiatric: He has a normal mood and affect. His behavior is normal. Judgment and thought content normal.          Assessment & Plan:  Well exam. Recheck the glucose in 6 months. He will reduce his carb intake.

## 2014-08-07 NOTE — Progress Notes (Signed)
Pre visit review using our clinic review tool, if applicable. No additional management support is needed unless otherwise documented below in the visit note. 

## 2014-08-18 ENCOUNTER — Telehealth: Payer: Self-pay | Admitting: Family Medicine

## 2014-08-18 NOTE — Telephone Encounter (Signed)
Pt stated he had the same issue with blood work for his preventative exam for the last 3 years. Pt has not called billing this year and tsh,bmp and liver panel was not completely  Covered again. I offered the pt billing number he has the number and denied to call them this year.

## 2014-08-28 NOTE — Telephone Encounter (Signed)
Had charges removed. Pt had already had welness labs drawn through his employer and provided those to [place]. He knows in the future to come fasting for his CPE and then the doctor can determine what labs, if any, need to be drawn.

## 2014-09-16 ENCOUNTER — Other Ambulatory Visit: Payer: Self-pay | Admitting: Family Medicine

## 2014-11-27 ENCOUNTER — Telehealth: Payer: Self-pay | Admitting: Family Medicine

## 2014-11-27 MED ORDER — HYDROCODONE-IBUPROFEN 7.5-200 MG PO TABS: ORAL_TABLET | ORAL | Status: DC

## 2014-11-27 NOTE — Telephone Encounter (Signed)
Refill request for Vioprofen and call pt when ready.

## 2014-11-27 NOTE — Telephone Encounter (Signed)
Script is ready for pick up and I left a voice message for pt. 

## 2014-11-27 NOTE — Telephone Encounter (Signed)
done

## 2014-12-06 ENCOUNTER — Other Ambulatory Visit: Payer: Self-pay | Admitting: Family Medicine

## 2014-12-06 NOTE — Telephone Encounter (Signed)
Call in #60 with 5 rf 

## 2014-12-07 NOTE — Telephone Encounter (Signed)
Call in Rx to pharmacy

## 2014-12-28 ENCOUNTER — Other Ambulatory Visit: Payer: Self-pay | Admitting: Family Medicine

## 2015-06-22 ENCOUNTER — Telehealth: Payer: Self-pay | Admitting: Family Medicine

## 2015-06-22 NOTE — Telephone Encounter (Signed)
Refill request for Lorazepam 1 mg take 1 po every 6 hrs prn and send to [facility].

## 2015-06-25 MED ORDER — LORAZEPAM 1 MG PO TABS: ORAL_TABLET | ORAL | Status: DC

## 2015-06-25 NOTE — Telephone Encounter (Signed)
I spoke with pt and he does have a physical schedule for 08/08/2015 with Dr. Sarajane Jews . He will be out of medication and would like a refill, if possible.

## 2015-06-25 NOTE — Telephone Encounter (Signed)
I called in script 

## 2015-06-25 NOTE — Telephone Encounter (Signed)
Ok to refill for one month  

## 2015-07-16 ENCOUNTER — Encounter: Payer: Self-pay | Admitting: Internal Medicine

## 2015-08-01 ENCOUNTER — Other Ambulatory Visit: Payer: 59

## 2015-08-01 DIAGNOSIS — Z0000 Encounter for general adult medical examination without abnormal findings: Secondary | ICD-10-CM

## 2015-08-01 LAB — POC URINALSYSI DIPSTICK (AUTOMATED)
Bilirubin, UA: NEGATIVE
Blood, UA: NEGATIVE
Glucose, UA: NEGATIVE
Ketones, UA: NEGATIVE
Leukocytes, UA: NEGATIVE
Nitrite, UA: NEGATIVE
PH UA: 6
PROTEIN UA: NEGATIVE
SPEC GRAV UA: 1.015
Urobilinogen, UA: 0.2

## 2015-08-01 LAB — LIPID PANEL
CHOL/HDL RATIO: 3
Cholesterol: 128 mg/dL (ref 0–200)
HDL: 43.5 mg/dL (ref >39.00)
LDL Cholesterol: 70 mg/dL (ref 0–99)
NONHDL: 84.26
Triglycerides: 72 mg/dL (ref 0.0–149.0)
VLDL: 14.4 mg/dL (ref 0.0–40.0)

## 2015-08-01 LAB — HEPATIC FUNCTION PANEL
ALBUMIN: 4.1 g/dL (ref 3.5–5.2)
ALT: 16 U/L (ref 0–53)
AST: 19 U/L (ref 0–37)
Alkaline Phosphatase: 73 U/L (ref 39–117)
Bilirubin, Direct: 0.2 mg/dL (ref 0.0–0.3)
TOTAL PROTEIN: 6.3 g/dL (ref 6.0–8.3)
Total Bilirubin: 1.3 mg/dL — ABNORMAL HIGH (ref 0.2–1.2)

## 2015-08-01 LAB — CBC WITH DIFFERENTIAL/PLATELET
BASOS PCT: 1.5 % (ref 0.0–3.0)
Basophils Absolute: 0.1 10*3/uL (ref 0.0–0.1)
EOS ABS: 0.1 10*3/uL (ref 0.0–0.7)
EOS PCT: 1.2 % (ref 0.0–5.0)
HEMATOCRIT: 40.9 % (ref 39.0–52.0)
Hemoglobin: 14 g/dL (ref 13.0–17.0)
LYMPHS PCT: 35.6 % (ref 12.0–46.0)
Lymphs Abs: 1.5 10*3/uL (ref 0.7–4.0)
MCHC: 34.3 g/dL (ref 30.0–36.0)
MCV: 87.5 fl (ref 78.0–100.0)
MONOS PCT: 10.2 % (ref 3.0–12.0)
Monocytes Absolute: 0.4 10*3/uL (ref 0.1–1.0)
NEUTROS ABS: 2.2 10*3/uL (ref 1.4–7.7)
Neutrophils Relative %: 51.5 % (ref 43.0–77.0)
PLATELETS: 265 10*3/uL (ref 150.0–400.0)
RBC: 4.68 Mil/uL (ref 4.22–5.81)
RDW: 13.4 % (ref 11.5–15.5)
WBC: 4.3 10*3/uL (ref 4.0–10.5)

## 2015-08-01 LAB — TSH: TSH: 1.01 u[IU]/mL (ref 0.35–4.50)

## 2015-08-01 LAB — PSA: PSA: 1.59 ng/mL (ref 0.10–4.00)

## 2015-08-07 ENCOUNTER — Encounter: Payer: Self-pay | Admitting: Internal Medicine

## 2015-08-08 ENCOUNTER — Ambulatory Visit (INDEPENDENT_AMBULATORY_CARE_PROVIDER_SITE_OTHER): Payer: 59 | Admitting: Family Medicine

## 2015-08-08 ENCOUNTER — Encounter: Payer: Self-pay | Admitting: Family Medicine

## 2015-08-08 VITALS — BP 138/71 | HR 60 | Temp 98.7°F | Ht 71.0 in | Wt 183.0 lb

## 2015-08-08 DIAGNOSIS — Z0000 Encounter for general adult medical examination without abnormal findings: Secondary | ICD-10-CM | POA: Diagnosis not present

## 2015-08-08 MED ORDER — LORAZEPAM 1 MG PO TABS: ORAL_TABLET | ORAL | Status: DC

## 2015-08-08 NOTE — Progress Notes (Signed)
Pre visit review using our clinic review tool, if applicable. No additional management support is needed unless otherwise documented below in the visit note. 

## 2015-08-08 NOTE — Progress Notes (Signed)
   Subjective:    Patient ID: Ricky Freeman, male    DOB: 08/29/1960, 54 y.o.   MRN: [id number]  HPI 55 yr old male for a well exam. He feels fine physically except for some back and left shoulder pain. He still works out at [employer] most every day. He has been under a lot of stress for the last few years dealing with a girlfriend who is struggling with addictions to alcohol and opiods. She has been in and out of recovery several times. He sees a therapist and he attends Al-Anon meetings.    Review of Systems  Constitutional: Negative.   HENT: Negative.   Eyes: Negative.   Respiratory: Negative.   Cardiovascular: Negative.   Gastrointestinal: Negative.   Genitourinary: Negative.   Musculoskeletal: Negative.   Skin: Negative.   Neurological: Negative.   Psychiatric/Behavioral: Positive for dysphoric mood. Negative for suicidal ideas, hallucinations, behavioral problems, confusion, sleep disturbance, self-injury, decreased concentration and agitation. The patient is nervous/anxious. The patient is not hyperactive.        Objective:   Physical Exam  Constitutional: He is oriented to person, place, and time. He appears well-developed and well-nourished. No distress.  HENT:  Head: Normocephalic and atraumatic.  Right Ear: External ear normal.  Left Ear: External ear normal.  Nose: Nose normal.  Mouth/Throat: Oropharynx is clear and moist. No oropharyngeal exudate.  Eyes: Conjunctivae and EOM are normal. Pupils are equal, round, and reactive to light. Right eye exhibits no discharge. Left eye exhibits no discharge. No scleral icterus.  Neck: Neck supple. No JVD present. No tracheal deviation present. No thyromegaly present.  Cardiovascular: Normal rate, regular rhythm, normal heart sounds and intact distal pulses.  Exam reveals no gallop and no friction rub.   No murmur heard. EKG shows stable LBBB  Pulmonary/Chest: Effort normal and breath sounds normal. No respiratory distress. He has  no wheezes. He has no rales. He exhibits no tenderness.  Abdominal: Soft. Bowel sounds are normal. He exhibits no distension and no mass. There is no tenderness. There is no rebound and no guarding.  Genitourinary: Rectum normal, prostate normal and penis normal. Guaiac negative stool. No penile tenderness.  Musculoskeletal: Normal range of motion. He exhibits no edema or tenderness.  Lymphadenopathy:    He has no cervical adenopathy.  Neurological: He is alert and oriented to person, place, and time. He has normal reflexes. No cranial nerve deficit. He exhibits normal muscle tone. Coordination normal.  Skin: Skin is warm and dry. No rash noted. He is not diaphoretic. No erythema. No pallor.  Psychiatric: He has a normal mood and affect. His behavior is normal. Judgment and thought content normal.          Assessment & Plan:  Well exam. We discussed diet and exercise. He will use Lorazepam as needed. He is due for another colonoscopy in July.  Laurey Morale, MD

## 2015-09-06 ENCOUNTER — Ambulatory Visit (AMBULATORY_SURGERY_CENTER): Payer: Self-pay

## 2015-09-06 ENCOUNTER — Encounter: Payer: Self-pay | Admitting: Gastroenterology

## 2015-09-06 VITALS — Ht 71.0 in | Wt 184.6 lb

## 2015-09-06 DIAGNOSIS — Z860100 Personal history of colon polyps, unspecified: Secondary | ICD-10-CM

## 2015-09-06 MED ORDER — SUPREP BOWEL PREP KIT 17.5-3.13-1.6 GM/177ML PO SOLN: 1.0000 | Freq: Once | ORAL | Status: DC

## 2015-09-06 NOTE — Progress Notes (Signed)
No allergies to eggs or soy No home oxygen No diet meds No past problems with anesthesia  Has email and internet; declined emmi 

## 2015-09-20 ENCOUNTER — Encounter: Payer: Self-pay | Admitting: Internal Medicine

## 2015-09-20 ENCOUNTER — Ambulatory Visit (AMBULATORY_SURGERY_CENTER): Payer: 59 | Admitting: Internal Medicine

## 2015-09-20 VITALS — BP 108/72 | HR 50 | Temp 97.8°F | Resp 17 | Ht 71.0 in | Wt 184.0 lb

## 2015-09-20 DIAGNOSIS — D123 Benign neoplasm of transverse colon: Secondary | ICD-10-CM

## 2015-09-20 DIAGNOSIS — Z86010 Personal history of colonic polyps: Secondary | ICD-10-CM | POA: Diagnosis not present

## 2015-09-20 HISTORY — PX: COLONOSCOPY WITH PROPOFOL: SHX5780

## 2015-09-20 MED ORDER — SODIUM CHLORIDE 0.9 % IV SOLN: 500.0000 mL | INTRAVENOUS | Status: DC

## 2015-09-20 NOTE — Patient Instructions (Signed)

## 2015-09-20 NOTE — Progress Notes (Signed)
Called to room to assist during endoscopic procedure.  Patient ID and intended procedure confirmed with present staff. Received instructions for my participation in the procedure from the performing physician.  

## 2015-09-20 NOTE — Progress Notes (Signed)
To recovery, report to Scott, RN, VSS 

## 2015-09-20 NOTE — Op Note (Signed)
[facility] Patient Name: Ricky Freeman Procedure Date: 09/20/2015 9:48 AM MRN: [id number] Endoscopist: Jerene Bears , MD Age: 55 Referring MD:  Date of Birth: 1961-01-12 Gender: Male Account #: [id number] Procedure:                Colonoscopy Indications:              Surveillance: Personal history of adenomatous                            polyps on last colonoscopy 3 years ago Medicines:                Monitored Anesthesia Care Procedure:                Pre-Anesthesia Assessment:                           - Prior to the procedure, a History and Physical                            was performed, and patient medications and                            allergies were reviewed. The patient's tolerance of                            previous anesthesia was also reviewed. The risks                            and benefits of the procedure and the sedation                            options and risks were discussed with the patient.                            All questions were answered, and informed consent                            was obtained. Prior Anticoagulants: The patient has                            taken no previous anticoagulant or antiplatelet                            agents. ASA Grade Assessment: II - A patient with                            mild systemic disease. After reviewing the risks                            and benefits, the patient was deemed in                            satisfactory condition to undergo the procedure.  After obtaining informed consent, the colonoscope                            was passed under direct vision. Throughout the                            procedure, the patient's blood pressure, pulse, and                            oxygen saturations were monitored continuously. The                            Model CF-HQ190L [id number]) scope was introduced                            through the anus and advanced  to the the cecum,                            identified by appendiceal orifice and ileocecal                            valve. The colonoscopy was performed without                            difficulty. The patient tolerated the procedure                            well. The quality of the bowel preparation was                            good. The ileocecal valve, appendiceal orifice, and                            rectum were photographed. Scope In: 9:59:26 AM Scope Out: 10:13:31 AM Scope Withdrawal Time: 0 hours 10 minutes 54 seconds  Total Procedure Duration: 0 hours 14 minutes 5 seconds  Findings:                 The digital rectal exam was normal.                           A 6 mm polyp was found in the transverse colon. The                            polyp was flat. The polyp was removed with a cold                            snare. Resection and retrieval were complete.                           The exam was otherwise without abnormality on                            direct and retroflexion views. Complications:  No immediate complications. Estimated Blood Loss:     Estimated blood loss was minimal. Impression:               - One 6 mm polyp in the transverse colon, removed                            with a cold snare. Resected and retrieved.                           - The examination was otherwise normal on direct                            and retroflexion views. Recommendation:           - Patient has a contact number available for                            emergencies. The signs and symptoms of potential                            delayed complications were discussed with the                            patient. Return to normal activities tomorrow.                            Written discharge instructions were provided to the                            patient.                           - Resume previous diet.                           - Continue present  medications.                           - Await pathology results.                           - Repeat colonoscopy in 5 years for adenoma                            surveillance. Jerene Bears, MD 09/20/2015 10:18:17 AM This report has been signed electronically.

## 2015-09-21 ENCOUNTER — Telehealth: Payer: Self-pay | Admitting: *Deleted

## 2015-09-21 NOTE — Telephone Encounter (Signed)
  Follow up Call-  Call back number 09/20/2015  Post procedure Call Back phone  # [phone] cell  Permission to leave phone message Yes     Patient questions:  Do you have a fever, pain , or abdominal swelling? No. Pain Score  0 *  Have you tolerated food without any problems? Yes.    Have you been able to return to your normal activities? Yes.    Do you have any questions about your discharge instructions: Diet   No. Medications  No. Follow up visit  No.  Do you have questions or concerns about your Care? No.  Actions: * If pain score is 4 or above: No action needed, pain <4.

## 2015-09-28 ENCOUNTER — Other Ambulatory Visit: Payer: Self-pay | Admitting: Emergency Medicine

## 2015-09-28 MED ORDER — TRIAMCINOLONE ACETONIDE 0.1 % EX CREA: 1.0000 "application " | TOPICAL_CREAM | Freq: Two times a day (BID) | CUTANEOUS | Status: DC | PRN

## 2015-10-01 ENCOUNTER — Encounter: Payer: Self-pay | Admitting: Internal Medicine

## 2015-10-11 ENCOUNTER — Telehealth: Payer: Self-pay | Admitting: Family Medicine

## 2015-10-11 DIAGNOSIS — M25512 Pain in left shoulder: Secondary | ICD-10-CM

## 2015-10-11 NOTE — Telephone Encounter (Signed)
Pt left a voice message requesting a referral for shoulder pain, stated that he had discussed with you in the past.

## 2015-10-12 NOTE — Telephone Encounter (Signed)
I left a voice message with below information. 

## 2015-10-12 NOTE — Telephone Encounter (Signed)
Referral was done  

## 2015-11-16 DIAGNOSIS — S46019A Strain of muscle(s) and tendon(s) of the rotator cuff of unspecified shoulder, initial encounter: Secondary | ICD-10-CM

## 2015-11-16 DIAGNOSIS — M7542 Impingement syndrome of left shoulder: Secondary | ICD-10-CM

## 2015-11-16 HISTORY — DX: Impingement syndrome of left shoulder: M75.42

## 2015-11-16 HISTORY — DX: Strain of muscle(s) and tendon(s) of the rotator cuff of unspecified shoulder, initial encounter: S46.019A

## 2015-11-16 HISTORY — PX: SHOULDER ARTHROSCOPY WITH ROTATOR CUFF REPAIR: SHX5685

## 2015-11-29 ENCOUNTER — Encounter (HOSPITAL_BASED_OUTPATIENT_CLINIC_OR_DEPARTMENT_OTHER): Payer: Self-pay | Admitting: *Deleted

## 2015-11-29 NOTE — Pre-Procedure Instructions (Signed)
LBBB discussed with Dr. Smith Robert; had coronary CTA/echo/stress test 2015.  OK to come for surgery.

## 2015-12-02 NOTE — H&P (Signed)
ORTHOPAEDIC HISTORY & PHYSICAL  Ricky Freeman MRN:  [id number] DOB/SEX:  27-Sep-1960/male  CHIEF COMPLAINT:  Painful left shoulder  HISTORY: Patient is a 55 y.o. male presented with a history of pain in the left shoulder for 8 months. Onset of symptoms was gradual starting 3 years ago with gradually worsening course since that time. Prior procedures on the knee are arthroscopy, rotator cuff repair and [place] repairs in 1992. Patient has been treated conservatively with over-the-counter NSAIDs and activity modification. Patient currently rates pain at 7 out of 10 with activity. There is no pain at night. present.  They have been previously treated with: NSAIDS: Steriods with mild improvement    PAST MEDICAL HISTORY: Patient Active Problem List   Diagnosis Date Noted  . LBBB (left bundle branch block) 08/03/2013  . Abnormal EKG 08/03/2013  . DEPRESSION 05/30/2009  . BENIGN PROSTATIC HYPERTROPHY, WITH OBSTRUCTION 05/30/2009  . LOW BACK PAIN 04/26/2008  . INSOMNIA, CHRONIC 01/12/2007  . CYST, SEBACEOUS 01/12/2007  . SHIN SPLINTS 01/12/2007  . LUMBAR STRAIN 01/12/2007  . VENEREAL WART 10/13/2006  . SPRAIN/STRAIN, ANKLE NEC 10/13/2006   Past Medical History:  Diagnosis Date  . Impingement syndrome of left shoulder 11/2015  . Left bundle branch block (LBBB) 2015  . Rotator cuff strain 11/2015   left  . Sleep apnea    Past Surgical History:  Procedure Laterality Date  . COLONOSCOPY WITH PROPOFOL  09/30/2012; 09/20/2015  . LASIK Bilateral   . SHOULDER ARTHROSCOPY WITH ROTATOR CUFF REPAIR Left   . TONSILLECTOMY    . VARICOCELECTOMY Bilateral 09/14/2001  . WISDOM TOOTH EXTRACTION       MEDICATIONS:   No prescriptions prior to admission.    ALLERGIES:  No Known Allergies  REVIEW OF SYSTEMS:  Pertinent items are noted in HPI.   FAMILY HISTORY:   Family History  Problem Relation Age of Onset  . Prostate cancer Father   . Colon cancer Mother 44    stage I colon ca with  part colectomy    SOCIAL HISTORY:   Social History  Substance Use Topics  . Smoking status: Never Smoker  . Smokeless tobacco: Never Used  . Alcohol use No      EXAMINATION: Vital signs in last 24 hours:    Head is normocephalic.   Eyes:  Pupils equal, round and reactive to light and accommodation.  Extraocular intact. ENT: Ears, nose, and throat were benign.   Neck: supple, no bruits were noted.   Chest: good expansion.   Lungs: essentially clear.   Cardiac: regular rhythm and rate, normal S1, S2.  No murmurs appreciated. Pulses :  2+ bilateral and symmetric in lower extremities. Abdomen is scaphoid, soft, nontender, no masses palpable, normal bowel sounds                  present. CNS:  He is oriented x3 and cranial nerves II-XII grossly intact. Breast, rectal, and genital exams: not performed and not indicated for an orthopedic evaluation. Musculoskeletal:   Ht 5' 11.5" (1.816 m)   Wt 83.9 kg (185 lb)   BMI 25.44 kg/m   General Appearance:    Alert, cooperative, no distress, appears stated age  Head:    Normocephalic, without obvious abnormality, atraumatic  Eyes:    PERRL, conjunctiva/corneas clear, EOM's intact, fundi    benign, both eyes       Ears:    Normal TM's and external ear canals, both ears  Nose:   Nares normal, septum  midline, mucosa normal, no drainage    or sinus tenderness  Throat:   Lips, mucosa, and tongue normal; teeth and gums normal  Neck:   Supple, symmetrical, trachea midline, no adenopathy;       thyroid:  No enlargement/tenderness/nodules; no carotid   bruit or JVD  Back:     Symmetric, no curvature, ROM normal, no CVA tenderness  Lungs:     Clear to auscultation bilaterally, respirations unlabored  Chest wall:    No tenderness or deformity  Heart:    Regular rate and rhythm, S1 and S2 normal, no murmur, rub   or gallop  Abdomen:     Soft, non-tender, bowel sounds active all four quadrants,    no masses, no organomegaly  Genitalia:     Normal male without lesion, discharge or tenderness  Rectal:    Normal tone, normal prostate, no masses or tenderness;   guaiac negative stool  Extremities:   Extremities normal, atraumatic, no cyanosis or edema  Pulses:   2+ and symmetric all extremities  Skin:   Skin color, texture, turgor normal, no rashes or lesions  Lymph nodes:   Cervical, supraclavicular, and axillary nodes normal  Neurologic:   CNII-XII intact. Normal strength, sensation and reflexes      throughout     Musculoskeletal:  ROMleft shoulder reveals 75% active range of motion.  Positive empty can.  Positive cross body.  He is neurovascularly intact distally.   , Ligaments weak to testing,    Imaging Review X-rays reveal moderate glenohumeral changes.  Type II acromion.  Moderate AC changes.  MRI shows considerable arthropathy in the shoulder itself. There is also moderate rotator cuff tendinopathy with small interstitial tears. There are a lot of abrasive changes on the top and bottom. No full thickness tear is seen. The long head of the biceps is intact. Significant changes at the [facility] joint creating a picture of impingement.  ASSESSMENT: Past Medical History:  Diagnosis Date  . Impingement syndrome of left shoulder 11/2015  . Left bundle branch block (LBBB) 2015  . Rotator cuff strain 11/2015   left  . Sleep apnea     PLAN: Plan for left shoulder exam under anesthesia. Chondroplasty and debridement of the glenohumeral joint, debridement of rotator cuff, subacromial decompression and distal clavicle excision.  The procedure,  risks, and benefits of total knee arthroplasty were presented and reviewed. The risks including but not limited to infection, blood clots, vascular and nerve injury, stiffness,  among others were discussed. The patient acknowledged the explanation, agreed to proceed.   Ricky Freeman B 12/02/2015, 8:47 PM

## 2015-12-06 ENCOUNTER — Ambulatory Visit (HOSPITAL_BASED_OUTPATIENT_CLINIC_OR_DEPARTMENT_OTHER): Payer: 59 | Admitting: Anesthesiology

## 2015-12-06 ENCOUNTER — Encounter (HOSPITAL_BASED_OUTPATIENT_CLINIC_OR_DEPARTMENT_OTHER): Payer: Self-pay

## 2015-12-06 ENCOUNTER — Encounter (HOSPITAL_BASED_OUTPATIENT_CLINIC_OR_DEPARTMENT_OTHER)
Admission: RE | Disposition: A | Discharge status: Home / Self Care | Payer: Self-pay | Source: Ambulatory Visit | Attending: Orthopedic Surgery

## 2015-12-06 ENCOUNTER — Ambulatory Visit (HOSPITAL_BASED_OUTPATIENT_CLINIC_OR_DEPARTMENT_OTHER)
Admission: RE | Admit: 2015-12-06 | Discharge: 2015-12-06 | Disposition: A | Discharge status: Home / Self Care | Payer: 59 | Source: Ambulatory Visit | Attending: Orthopedic Surgery | Admitting: Orthopedic Surgery

## 2015-12-06 DIAGNOSIS — M75102 Unspecified rotator cuff tear or rupture of left shoulder, not specified as traumatic: Secondary | ICD-10-CM | POA: Insufficient documentation

## 2015-12-06 DIAGNOSIS — G4730 Sleep apnea, unspecified: Secondary | ICD-10-CM | POA: Diagnosis not present

## 2015-12-06 DIAGNOSIS — M19012 Primary osteoarthritis, left shoulder: Secondary | ICD-10-CM | POA: Diagnosis not present

## 2015-12-06 DIAGNOSIS — M25512 Pain in left shoulder: Secondary | ICD-10-CM | POA: Diagnosis present

## 2015-12-06 DIAGNOSIS — I447 Left bundle-branch block, unspecified: Secondary | ICD-10-CM | POA: Diagnosis not present

## 2015-12-06 DIAGNOSIS — M7542 Impingement syndrome of left shoulder: Secondary | ICD-10-CM | POA: Diagnosis not present

## 2015-12-06 HISTORY — DX: Strain of muscle(s) and tendon(s) of the rotator cuff of unspecified shoulder, initial encounter: S46.019A

## 2015-12-06 HISTORY — DX: Left bundle-branch block, unspecified: I44.7

## 2015-12-06 HISTORY — DX: Impingement syndrome of left shoulder: M75.42

## 2015-12-06 SURGERY — SHOULDER ARTHROSCOPY WITH SUBACROMIAL DECOMPRESSION AND DISTAL CLAVICLE EXCISION
Anesthesia: Regional | Site: Shoulder | Laterality: Left

## 2015-12-06 MED ORDER — SCOPOLAMINE 1 MG/3DAYS TD PT72: 1.0000 | MEDICATED_PATCH | Freq: Once | TRANSDERMAL | Status: DC | PRN

## 2015-12-06 MED ORDER — LIDOCAINE HCL (CARDIAC) 20 MG/ML IV SOLN
INTRAVENOUS | Status: DC | PRN
  Administered 2015-12-06: 50 mg via INTRAVENOUS

## 2015-12-06 MED ORDER — GLYCOPYRROLATE 0.2 MG/ML IJ SOLN: 0.2000 mg | Freq: Once | INTRAMUSCULAR | Status: DC | PRN

## 2015-12-06 MED ORDER — EPHEDRINE SULFATE 50 MG/ML IJ SOLN
INTRAMUSCULAR | Status: DC | PRN
  Administered 2015-12-06 (×3): 10 mg via INTRAVENOUS

## 2015-12-06 MED ORDER — FENTANYL CITRATE (PF) 100 MCG/2ML IJ SOLN
50.0000 ug | INTRAMUSCULAR | Status: DC | PRN
  Administered 2015-12-06: 100 ug via INTRAVENOUS

## 2015-12-06 MED ORDER — FENTANYL CITRATE (PF) 100 MCG/2ML IJ SOLN
INTRAMUSCULAR | Status: AC
  Filled 2015-12-06: qty 2

## 2015-12-06 MED ORDER — CHLORHEXIDINE GLUCONATE 4 % EX LIQD: 60.0000 mL | Freq: Once | CUTANEOUS | Status: DC

## 2015-12-06 MED ORDER — CEFAZOLIN SODIUM-DEXTROSE 2-4 GM/100ML-% IV SOLN
2.0000 g | INTRAVENOUS | Status: AC
  Administered 2015-12-06: 2 g via INTRAVENOUS

## 2015-12-06 MED ORDER — LIDOCAINE 2% (20 MG/ML) 5 ML SYRINGE
INTRAMUSCULAR | Status: AC
  Filled 2015-12-06: qty 5

## 2015-12-06 MED ORDER — PROPOFOL 10 MG/ML IV BOLUS
INTRAVENOUS | Status: DC | PRN
  Administered 2015-12-06: 150 mg via INTRAVENOUS

## 2015-12-06 MED ORDER — SUCCINYLCHOLINE CHLORIDE 20 MG/ML IJ SOLN
INTRAMUSCULAR | Status: DC | PRN
  Administered 2015-12-06: 100 mg via INTRAVENOUS

## 2015-12-06 MED ORDER — BUPIVACAINE-EPINEPHRINE (PF) 0.5% -1:200000 IJ SOLN
INTRAMUSCULAR | Status: DC | PRN
  Administered 2015-12-06: 30 mL via PERINEURAL

## 2015-12-06 MED ORDER — ONDANSETRON HCL 4 MG/2ML IJ SOLN
INTRAMUSCULAR | Status: AC
  Filled 2015-12-06: qty 2

## 2015-12-06 MED ORDER — EPHEDRINE 5 MG/ML INJ
INTRAVENOUS | Status: AC
  Filled 2015-12-06: qty 10

## 2015-12-06 MED ORDER — ONDANSETRON HCL 4 MG/2ML IJ SOLN
INTRAMUSCULAR | Status: DC | PRN
  Administered 2015-12-06: 4 mg via INTRAVENOUS

## 2015-12-06 MED ORDER — DEXAMETHASONE SODIUM PHOSPHATE 4 MG/ML IJ SOLN
INTRAMUSCULAR | Status: DC | PRN
  Administered 2015-12-06: 10 mg via INTRAVENOUS

## 2015-12-06 MED ORDER — LACTATED RINGERS IV SOLN
INTRAVENOUS | Status: DC
  Administered 2015-12-06 (×2): via INTRAVENOUS

## 2015-12-06 MED ORDER — MIDAZOLAM HCL 2 MG/2ML IJ SOLN
1.0000 mg | INTRAMUSCULAR | Status: DC | PRN
  Administered 2015-12-06: 2 mg via INTRAVENOUS

## 2015-12-06 MED ORDER — DEXAMETHASONE SODIUM PHOSPHATE 10 MG/ML IJ SOLN
INTRAMUSCULAR | Status: AC
  Filled 2015-12-06: qty 1

## 2015-12-06 MED ORDER — OXYCODONE-ACETAMINOPHEN 5-325 MG PO TABS: 1.0000 | ORAL_TABLET | ORAL | 0 refills | Status: DC | PRN

## 2015-12-06 MED ORDER — MIDAZOLAM HCL 2 MG/2ML IJ SOLN
INTRAMUSCULAR | Status: AC
  Filled 2015-12-06: qty 2

## 2015-12-06 MED ORDER — BUPIVACAINE-EPINEPHRINE (PF) 0.5% -1:200000 IJ SOLN: INTRAMUSCULAR | Status: DC | PRN

## 2015-12-06 MED ORDER — CEFAZOLIN SODIUM-DEXTROSE 2-4 GM/100ML-% IV SOLN
INTRAVENOUS | Status: AC
  Filled 2015-12-06: qty 100

## 2015-12-06 MED ORDER — SODIUM CHLORIDE 0.9 % IR SOLN
Status: DC | PRN
  Administered 2015-12-06: 12000 mL

## 2015-12-06 MED ORDER — SUCCINYLCHOLINE CHLORIDE 200 MG/10ML IV SOSY
PREFILLED_SYRINGE | INTRAVENOUS | Status: AC
  Filled 2015-12-06: qty 10

## 2015-12-06 MED ORDER — HYDROMORPHONE HCL 1 MG/ML IJ SOLN: 0.2500 mg | INTRAMUSCULAR | Status: DC | PRN

## 2015-12-06 SURGICAL SUPPLY — 73 items
APL SKNCLS STERI-STRIP NONHPOA (GAUZE/BANDAGES/DRESSINGS)
BENZOIN TINCTURE PRP APPL 2/3 (GAUZE/BANDAGES/DRESSINGS) IMPLANT
BLADE CUTTER GATOR 3.5 (BLADE) ×3 IMPLANT
BLADE CUTTER MENIS 5.5 (BLADE) IMPLANT
BLADE GREAT WHITE 4.2 (BLADE) ×3 IMPLANT
BLADE SURG 15 STRL LF DISP TIS (BLADE) ×2 IMPLANT
BLADE SURG 15 STRL SS (BLADE) ×3
BUR OVAL 6.0 (BURR) ×5 IMPLANT
CANNULA DRY DOC 8X75 (CANNULA) IMPLANT
CANNULA TWIST IN 8.25X7CM (CANNULA) IMPLANT
DECANTER SPIKE VIAL GLASS SM (MISCELLANEOUS) IMPLANT
DRAPE STERI 35X30 U-POUCH (DRAPES) ×3 IMPLANT
DRAPE U-SHAPE 47X51 STRL (DRAPES) ×3 IMPLANT
DRAPE U-SHAPE 76X120 STRL (DRAPES) ×6 IMPLANT
DRSG PAD ABDOMINAL 8X10 ST (GAUZE/BANDAGES/DRESSINGS) ×3 IMPLANT
DURAPREP 26ML APPLICATOR (WOUND CARE) ×3 IMPLANT
ELECT MENISCUS 165MM 90D (ELECTRODE) ×3 IMPLANT
ELECT REM PT RETURN 9FT ADLT (ELECTROSURGICAL) ×3
ELECTRODE REM PT RTRN 9FT ADLT (ELECTROSURGICAL) ×2 IMPLANT
GAUZE SPONGE 4X4 12PLY STRL (GAUZE/BANDAGES/DRESSINGS) ×6 IMPLANT
GAUZE XEROFORM 1X8 LF (GAUZE/BANDAGES/DRESSINGS) ×3 IMPLANT
GLOVE BIO SURGEON STRL SZ7.5 (GLOVE) ×2 IMPLANT
GLOVE BIOGEL PI IND STRL 7.0 (GLOVE) ×1 IMPLANT
GLOVE BIOGEL PI IND STRL 7.5 (GLOVE) ×1 IMPLANT
GLOVE BIOGEL PI IND STRL 8 (GLOVE) ×2 IMPLANT
GLOVE BIOGEL PI INDICATOR 7.0 (GLOVE)
GLOVE BIOGEL PI INDICATOR 7.5 (GLOVE) ×1
GLOVE BIOGEL PI INDICATOR 8 (GLOVE) ×2
GLOVE ECLIPSE 7.0 STRL STRAW (GLOVE) ×5 IMPLANT
GLOVE SURG ORTHO 8.0 STRL STRW (GLOVE) ×3 IMPLANT
GOWN STRL REUS W/ TWL LRG LVL3 (GOWN DISPOSABLE) ×2 IMPLANT
GOWN STRL REUS W/ TWL XL LVL3 (GOWN DISPOSABLE) ×4 IMPLANT
GOWN STRL REUS W/TWL LRG LVL3 (GOWN DISPOSABLE) ×3
GOWN STRL REUS W/TWL XL LVL3 (GOWN DISPOSABLE) ×6
IV NS IRRIG 3000ML ARTHROMATIC (IV SOLUTION) ×8 IMPLANT
MANIFOLD NEPTUNE II (INSTRUMENTS) ×3 IMPLANT
NDL SCORPION MULTI FIRE (NEEDLE) IMPLANT
NDL SUT 6 .5 CRC .975X.05 MAYO (NEEDLE) IMPLANT
NEEDLE MAYO TAPER (NEEDLE)
NEEDLE SCORPION MULTI FIRE (NEEDLE) IMPLANT
NS IRRIG 1000ML POUR BTL (IV SOLUTION) IMPLANT
PACK ARTHROSCOPY DSU (CUSTOM PROCEDURE TRAY) ×3 IMPLANT
PASSER SUT SWANSON 36MM LOOP (INSTRUMENTS) IMPLANT
PENCIL BUTTON HOLSTER BLD 10FT (ELECTRODE) ×3 IMPLANT
SET ARTHROSCOPY TUBING (MISCELLANEOUS) ×3
SET ARTHROSCOPY TUBING LN (MISCELLANEOUS) ×2 IMPLANT
SLEEVE SCD COMPRESS KNEE MED (MISCELLANEOUS) ×2 IMPLANT
SLING ARM FOAM STRAP LRG (SOFTGOODS) ×2 IMPLANT
SLING ARM IMMOBILIZER LRG (SOFTGOODS) IMPLANT
SLING ARM IMMOBILIZER MED (SOFTGOODS) IMPLANT
SLING ARM MED ADULT FOAM STRAP (SOFTGOODS) IMPLANT
SLING ARM XL FOAM STRAP (SOFTGOODS) IMPLANT
SPONGE LAP 4X18 X RAY DECT (DISPOSABLE) IMPLANT
STRIP CLOSURE SKIN 1/2X4 (GAUZE/BANDAGES/DRESSINGS) IMPLANT
SUCTION FRAZIER HANDLE 10FR (MISCELLANEOUS)
SUCTION TUBE FRAZIER 10FR DISP (MISCELLANEOUS) IMPLANT
SUT ETHIBOND 2 OS 4 DA (SUTURE) IMPLANT
SUT ETHILON 2 0 FS 18 (SUTURE) IMPLANT
SUT ETHILON 3 0 PS 1 (SUTURE) IMPLANT
SUT FIBERWIRE #2 38 T-5 BLUE (SUTURE)
SUT RETRIEVER MED (INSTRUMENTS) IMPLANT
SUT TIGER TAPE 7 IN WHITE (SUTURE) IMPLANT
SUT VIC AB 0 CT1 27 (SUTURE)
SUT VIC AB 0 CT1 27XBRD ANBCTR (SUTURE) IMPLANT
SUT VIC AB 2-0 SH 27 (SUTURE)
SUT VIC AB 2-0 SH 27XBRD (SUTURE) IMPLANT
SUT VIC AB 3-0 FS2 27 (SUTURE) IMPLANT
SUTURE FIBERWR #2 38 T-5 BLUE (SUTURE) IMPLANT
TAPE FIBER 2MM 7IN #2 BLUE (SUTURE) IMPLANT
TOWEL OR 17X24 6PK STRL BLUE (TOWEL DISPOSABLE) ×3 IMPLANT
TOWEL OR NON WOVEN STRL DISP B (DISPOSABLE) ×3 IMPLANT
WATER STERILE IRR 1000ML POUR (IV SOLUTION) ×3 IMPLANT
YANKAUER SUCT BULB TIP NO VENT (SUCTIONS) IMPLANT

## 2015-12-06 NOTE — Anesthesia Procedure Notes (Addendum)
Anesthesia Regional Block:  Interscalene brachial plexus block  Pre-Anesthetic Checklist: ,, timeout performed, Correct Patient, Correct Site, Correct Laterality, Correct Procedure, Correct Position, site marked, Risks and benefits discussed, pre-op evaluation,  At surgeon's request and post-op pain management  Laterality: Left  Prep: Maximum Sterile Barrier Precautions used, chloraprep       Needles:  Injection technique: Single-shot  Needle Type: Echogenic Stimulator Needle     Needle Length: 5cm 5 cm Needle Gauge: 22 and 22 G    Additional Needles:  Procedures: ultrasound guided (picture in chart) and nerve stimulator Interscalene brachial plexus block  Nerve Stimulator or Paresthesia:  Response: Biceps response,   Additional Responses:   Narrative:  Start time: 12/06/2015 10:05 AM End time: 12/06/2015 10:15 AM Injection made incrementally with aspirations every 5 mL. Anesthesiologist: Roderic Palau  Additional Notes: 2% Lidocaine skin wheel.

## 2015-12-06 NOTE — Transfer of Care (Signed)
Immediate Anesthesia Transfer of Care Note  Patient: Ricky Freeman  Procedure(s) Performed: Procedure(s): LEFT SHOULDER ARTHROSCOPY WITH DEBRIDEMENT SUBACROMIAL DECOMPRESSION, DISTAL CLAVICLE RESECTION AND ROTATOR CUFF REPAIR. (Left)  Patient Location: PACU  Anesthesia Type:General and GA combined with regional for post-op pain  Level of Consciousness: awake  Airway & Oxygen Therapy: Patient Spontanous Breathing and Patient connected to face mask oxygen  Post-op Assessment: Report given to RN and Post -op Vital signs reviewed and stable  Post vital signs: Reviewed and stable  Last Vitals:  Vitals:   12/06/15 1146 12/06/15 1147  BP:    Pulse: 73 74  Resp:  10  Temp:      Last Pain:  Vitals:   12/06/15 0927  TempSrc: Oral  PainSc: 5       Patients Stated Pain Goal: 2 ([id number])  Complications: No apparent anesthesia complications

## 2015-12-06 NOTE — Anesthesia Postprocedure Evaluation (Signed)
Anesthesia Post Note  Patient: Ricky Freeman  Procedure(s) Performed: Procedure(s) (LRB): LEFT SHOULDER ARTHROSCOPY WITH DEBRIDEMENT SUBACROMIAL DECOMPRESSION, DISTAL CLAVICLE RESECTION AND ROTATOR CUFF REPAIR. (Left)  Patient location during evaluation: PACU Anesthesia Type: General and Regional Level of consciousness: awake and alert Pain management: pain level controlled Vital Signs Assessment: post-procedure vital signs reviewed and stable Respiratory status: spontaneous breathing, nonlabored ventilation and respiratory function stable Cardiovascular status: blood pressure returned to baseline and stable Postop Assessment: no signs of nausea or vomiting Anesthetic complications: no    Last Vitals:  Vitals:   12/06/15 1215 12/06/15 1221  BP: 135/85   Pulse: 65 65  Resp: 11 18  Temp:      Last Pain:  Vitals:   12/06/15 1221  TempSrc:   PainSc: 0-No pain                 Effie Wahlert,W. EDMOND

## 2015-12-06 NOTE — Anesthesia Procedure Notes (Signed)
Procedure Name: Intubation Date/Time: 12/06/2015 10:34 AM Performed by: Lieutenant Diego Pre-anesthesia Checklist: Patient identified, Emergency Drugs available, Suction available and Patient being monitored Patient Re-evaluated:Patient Re-evaluated prior to inductionOxygen Delivery Method: Circle system utilized Preoxygenation: Pre-oxygenation with 100% oxygen Intubation Type: IV induction Ventilation: Mask ventilation without difficulty Laryngoscope Size: Miller and 2 Grade View: Grade I Tube type: Oral Tube size: 8.0 mm Number of attempts: 1 Airway Equipment and Method: Stylet and Oral airway Placement Confirmation: ETT inserted through vocal cords under direct vision,  positive ETCO2 and breath sounds checked- equal and bilateral Secured at: 23 cm Tube secured with: Tape Dental Injury: Teeth and Oropharynx as per pre-operative assessment

## 2015-12-06 NOTE — Interval H&P Note (Signed)
History and Physical Interval Note:  12/06/2015 10:21 AM  Ricky Freeman  has presented today for surgery, with the diagnosis of Left Shoulder Impingement syndrome,Strain Muscle and Tendon of the rotator cuff  The various methods of treatment have been discussed with the patient and family. After consideration of risks, benefits and other options for treatment, the patient has consented to  Procedure(s): LEFT SHOULDER ARTHROSCOPY WITH DEBRIDEMENT SUBACROMIAL DECOMPRESSION, DISTAL CLAVICLE RESECTION AND ROTATOR CUFF REPAIR. (Left) as a surgical intervention .  The patient's history has been reviewed, patient examined, no change in status, stable for surgery.  I have reviewed the patient's chart and labs.  Questions were answered to the patient's satisfaction.     Ricky Freeman

## 2015-12-06 NOTE — Discharge Instructions (Signed)
Shoulder arthroscopy, acromioclavicular joint debridement subacromial decompression Care After Instructions Refer to this sheet in the next few weeks. These discharge instructions provide you with general information on caring for yourself after you leave the hospital. Your caregiver may also give you specific instructions. Your treatment has been planned according to the most current medical practices available, but unavoidable complications sometimes occur. If you have any problems or questions after discharge, please call your caregiver. HOME INSTRUCTIONS You may resume a normal diet and activities as directed. Take showers instead of baths until informed otherwise.  Change bandages (dressings) in 3 days.  Swab wounds daily with betadine.  Wash shoulder with soap and water.  Pat dry.  Cover wounds with bandaids. Only take over-the-counter or prescription medicines for pain, discomfort, or fever as directed by your caregiver.  Wear your sling for the next 2 days unless otherwise instructed. Eat a well-balanced diet.  Avoid lifting or driving until you are instructed otherwise.  Make an appointment to see your caregiver for stitches (suture) or staple removal one week after surgery.  SEEK MEDICAL CARE IF: You have swelling of your calf or leg.  You develop shortness of breath or chest pain.  You have redness, swelling, or increasing pain in the wound.  There is pus or any unusual drainage coming from the surgical site.  You notice a bad smell coming from the surgical site or dressing.  The surgical site breaks open after sutures or staples have been removed.  There is persistent bleeding from the suture or staple line.  You are getting worse or are not improving.  You have any other questions or concerns.  SEEK IMMEDIATE MEDICAL CARE IF:  You have a fever greater than 101 You develop a rash.  You have difficulty breathing.  You develop any reaction or side effects to medicines given.    Your knee motion is decreasing rather than improving.  MAKE SURE YOU:  Understand these instructions.  Will watch your condition.  Will get help right away if you are not doing well or get worse.   Regional Anesthesia Blocks  1. Numbness or the inability to move the "blocked" extremity may last from 3-48 hours after placement. The length of time depends on the medication injected and your individual response to the medication. If the numbness is not going away after 48 hours, call your surgeon.  2. The extremity that is blocked will need to be protected until the numbness is gone and the  Strength has returned. Because you cannot feel it, you will need to take extra care to avoid injury. Because it may be weak, you may have difficulty moving it or using it. You may not know what position it is in without looking at it while the block is in effect.  3. For blocks in the legs and feet, returning to weight bearing and walking needs to be done carefully. You will need to wait until the numbness is entirely gone and the strength has returned. You should be able to move your leg and foot normally before you try and bear weight or walk. You will need someone to be with you when you first try to ensure you do not fall and possibly risk injury.  4. Bruising and tenderness at the needle site are common side effects and will resolve in a few days.  5. Persistent numbness or new problems with movement should be communicated to the surgeon or the [place] [phone] [facility] [phone]).  Post Anesthesia Home Care Instructions  Activity: Get plenty of rest for the remainder of the day. A responsible adult should stay with you for 24 hours following the procedure.  For the next 24 hours, DO NOT: -Drive a car -Paediatric nurse -Drink alcoholic beverages -Take any medication unless instructed by your physician -Make any legal decisions or sign important  papers.  Meals: Start with liquid foods such as gelatin or soup. Progress to regular foods as tolerated. Avoid greasy, spicy, heavy foods. If nausea and/or vomiting occur, drink only clear liquids until the nausea and/or vomiting subsides. Call your physician if vomiting continues.  Special Instructions/Symptoms: Your throat may feel dry or sore from the anesthesia or the breathing tube placed in your throat during surgery. If this causes discomfort, gargle with warm salt water. The discomfort should disappear within 24 hours.  If you had a scopolamine patch placed behind your ear for the management of post- operative nausea and/or vomiting:  1. The medication in the patch is effective for 72 hours, after which it should be removed.  Wrap patch in a tissue and discard in the trash. Wash hands thoroughly with soap and water. 2. You may remove the patch earlier than 72 hours if you experience unpleasant side effects which may include dry mouth, dizziness or visual disturbances. 3. Avoid touching the patch. Wash your hands with soap and water after contact with the patch.

## 2015-12-06 NOTE — Progress Notes (Signed)
Assisted Dr. Edmond Fitzgerald with left, ultrasound guided, interscalene  block. Side rails up, monitors on throughout procedure. See vital signs in flow sheet. Tolerated Procedure well.  

## 2015-12-06 NOTE — Anesthesia Preprocedure Evaluation (Signed)
Anesthesia Evaluation  Patient identified by MRN, date of birth, ID band Patient awake    Reviewed: Allergy & Precautions, H&P , NPO status , Patient's Chart, lab work & pertinent test results  Airway Mallampati: I  TM Distance: >3 FB Neck ROM: Full    Dental no notable dental hx. (+) Teeth Intact, Dental Advisory Given   Pulmonary neg pulmonary ROS,    Pulmonary exam normal breath sounds clear to auscultation       Cardiovascular + dysrhythmias  Rhythm:Regular Rate:Normal     Neuro/Psych Depression negative neurological ROS     GI/Hepatic negative GI ROS, Neg liver ROS,   Endo/Other  negative endocrine ROS  Renal/GU negative Renal ROS  negative genitourinary   Musculoskeletal   Abdominal   Peds  Hematology negative hematology ROS (+)   Anesthesia Other Findings   Reproductive/Obstetrics negative OB ROS                             Anesthesia Physical Anesthesia Plan  ASA: II  Anesthesia Plan: General and Regional   Post-op Pain Management: GA combined w/ Regional for post-op pain   Induction: Intravenous  Airway Management Planned: Oral ETT  Additional Equipment:   Intra-op Plan:   Post-operative Plan: Extubation in OR  Informed Consent: I have reviewed the patients History and Physical, chart, labs and discussed the procedure including the risks, benefits and alternatives for the proposed anesthesia with the patient or authorized representative who has indicated his/her understanding and acceptance.   Dental advisory given  Plan Discussed with: CRNA  Anesthesia Plan Comments:         Anesthesia Quick Evaluation

## 2015-12-07 NOTE — Op Note (Signed)
NAMEDICKEY, SCHETTINO NO.:  [id number]  MEDICAL RECORD NO.:  [id number]  LOCATION:                                 FACILITY:  PHYSICIAN:  Ninetta Lights, M.D. DATE OF BIRTH:  [birth year]  DATE OF PROCEDURE: DATE OF DISCHARGE:                              OPERATIVE REPORT   PREOPERATIVE DIAGNOSES:  Left shoulder impingement.  Previous Bankart reconstruction more than 20 years ago.  Now with approaching end-stage arthritis glenohumeral joint.  No resultant instability.  Degenerative joint disease, AC joint.  POSTOPERATIVE DIAGNOSES:  Left shoulder impingement.  Previous Bankart reconstruction more than 20 years ago.  Now with approaching end-stage arthritis glenohumeral joint.  No resultant instability.  Degenerative joint disease, AC joint with marked grade 4 changes throughout the glenohumeral joint.  Bony erosion of the glenoid to the edge of the metallic anchors that were previously buried in the glenoid.  Partial tearing of rotator cuff above and below.  Grade 4 changes of AC joint.  PROCEDURES:  Left shoulder exam under anesthesia, arthroscopy, extensive debridement of glenohumeral joint, chondroplasty, debridement of labrum, debridement of loose bodies.  Debridement of rotator cuff above and below.  Bursectomy, acromioplasty, CA ligament release, excision of distal clavicle.  SURGEON:  Ninetta Lights, M.D.  ASSISTANT:  Lowell Guitar. Mercie Eon., present throughout the entire case, necessary for timely completion of procedure.  ANESTHESIA:  General.  BLOOD LOSS:  Minimal.  SPECIMENS:  None.  CULTURES:  None.  COMPLICATIONS:  None.  DRESSING:  Soft compressive with sling.  DESCRIPTION OF PROCEDURE:  The patient was brought to the operating room and placed on the operating table in a supine position.  After adequate anesthesia had been obtained, placed in beach-chair position on shoulder positioner, prepped and draped in the usual sterile  fashion.  Fairly good motion, no instability.  Three portals; anterior, posterior, and lateral.  Arthroscope introduced, shoulder distended and inspected. Grade 4 changes over most of the joint.  There was enough glenoid erosion that you could see to the very edge of the metallic anchors, which had previously been buried inside the glenoid.  Loose bodies, loose fragments removed.  Degenerative labral tears removed.  Biceps tendon and anchor intact.  A little partial tearing and undersurface rotator cuff debrided.  No full-thickness tears.  Cannula redirected subacromially.  Type 2 acromion.  Abrasive changes on the top of the cuff.  Bursa resected, cuff debrided.  Acromioplasty to a type 1 acromion, releasing the CA ligament.  Grade 4 changes, spurring AC joint.  Periarticular spurs, lateral centimeter of clavicle resected. Adequacy of decompression confirmed viewing from all portals. Instruments fully removed.  Portals closed with nylon.  Sterile compressive dressing applied.  Sling applied.  Anesthesia reversed. Brought to the recovery room.  Tolerated the surgery well.  No complications.     Ninetta Lights, M.D.   ______________________________ Ninetta Lights, M.D.    DFM/MEDQ  D:  12/06/2015  T:  12/07/2015  Job:  [id number]

## 2016-02-26 ENCOUNTER — Other Ambulatory Visit: Payer: Self-pay | Admitting: Family Medicine

## 2016-02-26 NOTE — Telephone Encounter (Signed)
Call in #60 with 5 rf 

## 2016-02-26 NOTE — Telephone Encounter (Signed)
Last seen on 08/08/15.  Filled for 6 months same day. No future appt scheduled.  Please advise.  Thanks!!

## 2016-02-27 NOTE — Telephone Encounter (Signed)
Called to the pharmacy and left on machine. 

## 2016-03-21 DIAGNOSIS — M25512 Pain in left shoulder: Secondary | ICD-10-CM | POA: Diagnosis not present

## 2016-03-26 DIAGNOSIS — M6281 Muscle weakness (generalized): Secondary | ICD-10-CM | POA: Diagnosis not present

## 2016-03-26 DIAGNOSIS — M25612 Stiffness of left shoulder, not elsewhere classified: Secondary | ICD-10-CM | POA: Diagnosis not present

## 2016-03-26 DIAGNOSIS — M25512 Pain in left shoulder: Secondary | ICD-10-CM | POA: Diagnosis not present

## 2016-03-28 DIAGNOSIS — M19012 Primary osteoarthritis, left shoulder: Secondary | ICD-10-CM | POA: Diagnosis not present

## 2016-04-04 DIAGNOSIS — M19012 Primary osteoarthritis, left shoulder: Secondary | ICD-10-CM | POA: Diagnosis not present

## 2016-04-11 DIAGNOSIS — M19012 Primary osteoarthritis, left shoulder: Secondary | ICD-10-CM | POA: Diagnosis not present

## 2016-04-24 ENCOUNTER — Ambulatory Visit (INDEPENDENT_AMBULATORY_CARE_PROVIDER_SITE_OTHER): Payer: 59 | Admitting: Psychology

## 2016-04-24 DIAGNOSIS — F4323 Adjustment disorder with mixed anxiety and depressed mood: Secondary | ICD-10-CM | POA: Diagnosis not present

## 2016-05-07 ENCOUNTER — Ambulatory Visit (INDEPENDENT_AMBULATORY_CARE_PROVIDER_SITE_OTHER): Payer: 59 | Admitting: Psychology

## 2016-05-07 DIAGNOSIS — F4323 Adjustment disorder with mixed anxiety and depressed mood: Secondary | ICD-10-CM

## 2016-05-21 ENCOUNTER — Ambulatory Visit (INDEPENDENT_AMBULATORY_CARE_PROVIDER_SITE_OTHER): Payer: 59 | Admitting: Psychology

## 2016-05-21 DIAGNOSIS — F4323 Adjustment disorder with mixed anxiety and depressed mood: Secondary | ICD-10-CM

## 2016-06-05 ENCOUNTER — Ambulatory Visit (INDEPENDENT_AMBULATORY_CARE_PROVIDER_SITE_OTHER): Payer: 59 | Admitting: Psychology

## 2016-06-05 DIAGNOSIS — F4323 Adjustment disorder with mixed anxiety and depressed mood: Secondary | ICD-10-CM | POA: Diagnosis not present

## 2016-06-10 ENCOUNTER — Telehealth: Payer: Self-pay | Admitting: Family Medicine

## 2016-06-10 NOTE — Telephone Encounter (Signed)
Pt left a voice message requesting a script for Tobradex eye drop, pt has a sty and has used these drops in the past. Pt has CPE scheduled for 08/14/2016.

## 2016-06-10 NOTE — Telephone Encounter (Signed)
Call in Tobradex eye susp., apply 2 drops eevry 4 hours prn, 10 ml with no rf

## 2016-06-11 MED ORDER — TOBRAMYCIN-DEXAMETHASONE 0.3-0.1 % OP SUSP: OPHTHALMIC | 0 refills | Status: DC

## 2016-06-11 NOTE — Telephone Encounter (Signed)
I sent script e-scribe to [facility] and left a voice message for pt with this information.

## 2016-06-19 ENCOUNTER — Ambulatory Visit: Payer: 59 | Admitting: Psychology

## 2016-07-03 ENCOUNTER — Ambulatory Visit (INDEPENDENT_AMBULATORY_CARE_PROVIDER_SITE_OTHER): Payer: 59 | Admitting: Psychology

## 2016-07-03 DIAGNOSIS — F4323 Adjustment disorder with mixed anxiety and depressed mood: Secondary | ICD-10-CM | POA: Diagnosis not present

## 2016-07-17 ENCOUNTER — Ambulatory Visit (INDEPENDENT_AMBULATORY_CARE_PROVIDER_SITE_OTHER): Payer: 59 | Admitting: Psychology

## 2016-07-17 DIAGNOSIS — F4323 Adjustment disorder with mixed anxiety and depressed mood: Secondary | ICD-10-CM | POA: Diagnosis not present

## 2016-08-05 ENCOUNTER — Ambulatory Visit (INDEPENDENT_AMBULATORY_CARE_PROVIDER_SITE_OTHER): Payer: 59 | Admitting: Psychology

## 2016-08-05 DIAGNOSIS — F4323 Adjustment disorder with mixed anxiety and depressed mood: Secondary | ICD-10-CM | POA: Diagnosis not present

## 2016-08-14 ENCOUNTER — Encounter: Payer: Self-pay | Admitting: Family Medicine

## 2016-08-14 ENCOUNTER — Ambulatory Visit (INDEPENDENT_AMBULATORY_CARE_PROVIDER_SITE_OTHER): Payer: 59 | Admitting: Family Medicine

## 2016-08-14 VITALS — BP 117/81 | HR 66 | Temp 98.2°F | Ht 71.5 in | Wt 184.0 lb

## 2016-08-14 DIAGNOSIS — Z0000 Encounter for general adult medical examination without abnormal findings: Secondary | ICD-10-CM

## 2016-08-14 LAB — POC URINALSYSI DIPSTICK (AUTOMATED)
Bilirubin, UA: NEGATIVE
Blood, UA: NEGATIVE
GLUCOSE UA: NEGATIVE
Ketones, UA: NEGATIVE
Leukocytes, UA: NEGATIVE
Nitrite, UA: NEGATIVE
Protein, UA: NEGATIVE
SPEC GRAV UA: 1.01 (ref 1.010–1.025)
UROBILINOGEN UA: 0.2 U/dL
pH, UA: 6 (ref 5.0–8.0)

## 2016-08-14 LAB — CBC WITH DIFFERENTIAL/PLATELET
BASOS PCT: 1 % (ref 0.0–3.0)
Basophils Absolute: 0.1 10*3/uL (ref 0.0–0.1)
EOS PCT: 0.8 % (ref 0.0–5.0)
Eosinophils Absolute: 0.1 10*3/uL (ref 0.0–0.7)
HEMATOCRIT: 42.9 % (ref 39.0–52.0)
Hemoglobin: 14.7 g/dL (ref 13.0–17.0)
Lymphocytes Relative: 32.1 % (ref 12.0–46.0)
Lymphs Abs: 1.9 10*3/uL (ref 0.7–4.0)
MCHC: 34.3 g/dL (ref 30.0–36.0)
MCV: 86.2 fl (ref 78.0–100.0)
MONO ABS: 0.5 10*3/uL (ref 0.1–1.0)
MONOS PCT: 9 % (ref 3.0–12.0)
Neutro Abs: 3.4 10*3/uL (ref 1.4–7.7)
Neutrophils Relative %: 57.1 % (ref 43.0–77.0)
Platelets: 239 10*3/uL (ref 150.0–400.0)
RBC: 4.97 Mil/uL (ref 4.22–5.81)
RDW: 13.4 % (ref 11.5–15.5)
WBC: 6 10*3/uL (ref 4.0–10.5)

## 2016-08-14 LAB — HEPATIC FUNCTION PANEL
ALK PHOS: 90 U/L (ref 39–117)
ALT: 22 U/L (ref 0–53)
AST: 22 U/L (ref 0–37)
Albumin: 4.4 g/dL (ref 3.5–5.2)
BILIRUBIN DIRECT: 0.2 mg/dL (ref 0.0–0.3)
TOTAL PROTEIN: 6.8 g/dL (ref 6.0–8.3)
Total Bilirubin: 1.4 mg/dL — ABNORMAL HIGH (ref 0.2–1.2)

## 2016-08-14 LAB — PSA: PSA: 2.76 ng/mL (ref 0.10–4.00)

## 2016-08-14 LAB — BASIC METABOLIC PANEL
BUN: 17 mg/dL (ref 6–23)
CO2: 30 mEq/L (ref 19–32)
Calcium: 9.3 mg/dL (ref 8.4–10.5)
Chloride: 102 mEq/L (ref 96–112)
Creatinine, Ser: 1.27 mg/dL (ref 0.40–1.50)
GFR: 62.8 mL/min (ref >60.00)
GLUCOSE: 87 mg/dL (ref 70–99)
POTASSIUM: 4.6 meq/L (ref 3.5–5.1)
SODIUM: 138 meq/L (ref 135–145)

## 2016-08-14 LAB — TSH: TSH: 1.2 u[IU]/mL (ref 0.35–4.50)

## 2016-08-14 NOTE — Patient Instructions (Signed)
WE NOW OFFER   [facility] FAST TRACK!!!  SAME DAY Appointments for ACUTE CARE  Such as: Sprains, Injuries, cuts, abrasions, rashes, muscle pain, joint pain, back pain Colds, flu, sore throats, headache, allergies, cough, fever  Ear pain, sinus and eye infections Abdominal pain, nausea, vomiting, diarrhea, upset stomach Animal/insect bites  3 Easy Ways to Schedule: Walk-In Scheduling Call in scheduling Mychart Sign-up: [email or web address]         

## 2016-08-14 NOTE — Progress Notes (Signed)
   Subjective:    Patient ID: Ricky Freeman, male    DOB: 1960-10-08, 56 y.o.   MRN: [id number]  HPI 56 yr old male for a well exam. He feels fine except for left shoulder problems. He had arthroscopic surgery last fall which was not very successful. He does not have much pain now but his ROM is very limited. He had PT for awhile and now he does at home exercises daily. He had a wellness check at work a few weeks ago and a lipid panel revealed an LDL of 72.    Review of Systems  Constitutional: Negative.   HENT: Negative.   Eyes: Negative.   Respiratory: Negative.   Cardiovascular: Negative.   Gastrointestinal: Negative.   Genitourinary: Negative.   Musculoskeletal: Negative.   Skin: Negative.   Neurological: Negative.   Psychiatric/Behavioral: Negative.        Objective:   Physical Exam  Constitutional: He is oriented to person, place, and time. He appears well-developed and well-nourished. No distress.  HENT:  Head: Normocephalic and atraumatic.  Right Ear: External ear normal.  Left Ear: External ear normal.  Nose: Nose normal.  Mouth/Throat: Oropharynx is clear and moist. No oropharyngeal exudate.  Eyes: Conjunctivae and EOM are normal. Pupils are equal, round, and reactive to light. Right eye exhibits no discharge. Left eye exhibits no discharge. No scleral icterus.  Neck: Neck supple. No JVD present. No tracheal deviation present. No thyromegaly present.  Cardiovascular: Normal rate, regular rhythm, normal heart sounds and intact distal pulses.  Exam reveals no gallop and no friction rub.   No murmur heard. Pulmonary/Chest: Effort normal and breath sounds normal. No respiratory distress. He has no wheezes. He has no rales. He exhibits no tenderness.  Abdominal: Soft. Bowel sounds are normal. He exhibits no distension and no mass. There is no tenderness. There is no rebound and no guarding.  Genitourinary: Rectum normal, prostate normal and penis normal. Rectal exam shows  guaiac negative stool. No penile tenderness.  Musculoskeletal: Normal range of motion. He exhibits no edema or tenderness.  Lymphadenopathy:    He has no cervical adenopathy.  Neurological: He is alert and oriented to person, place, and time. He has normal reflexes. No cranial nerve deficit. He exhibits normal muscle tone. Coordination normal.  Skin: Skin is warm and dry. No rash noted. He is not diaphoretic. No erythema. No pallor.  Psychiatric: He has a normal mood and affect. His behavior is normal. Judgment and thought content normal.          Assessment & Plan:  Well exam. We discussed diet and exercise. Get labs today.  Alysia Penna, MD

## 2016-11-04 ENCOUNTER — Telehealth: Payer: Self-pay | Admitting: Family Medicine

## 2016-11-04 MED ORDER — LORAZEPAM 1 MG PO TABS: ORAL_TABLET | ORAL | 5 refills | Status: DC

## 2016-11-04 NOTE — Telephone Encounter (Signed)
° ° ° ° °  Pt request refill of the following:   LORazepam (ATIVAN) 1 MG tablet  Phamacy: [facility] [address]

## 2016-11-04 NOTE — Telephone Encounter (Signed)
I [facility] in [facility] to [facility] [address], per pt request.

## 2016-11-04 NOTE — Telephone Encounter (Signed)
Call in #60 with 5 rf 

## 2016-12-01 ENCOUNTER — Telehealth: Payer: Self-pay | Admitting: Family Medicine

## 2016-12-01 NOTE — Telephone Encounter (Signed)
Pt need new Rx for rizatriptan (MAXALT) 10 MG  Pharm:  [facility] [address]

## 2016-12-01 NOTE — Telephone Encounter (Signed)
Call in Maxalt 10 mg to use prn migraines, #10 with 11 rf

## 2016-12-01 NOTE — Telephone Encounter (Signed)
I do not see on current medication list.

## 2016-12-03 MED ORDER — RIZATRIPTAN BENZOATE 10 MG PO TABS: 10.0000 mg | ORAL_TABLET | ORAL | 11 refills | Status: DC | PRN

## 2016-12-03 NOTE — Telephone Encounter (Signed)
I sent script e-scribe to [facility].

## 2017-01-02 DIAGNOSIS — M19012 Primary osteoarthritis, left shoulder: Secondary | ICD-10-CM | POA: Diagnosis not present

## 2017-03-19 NOTE — Pre-Procedure Instructions (Signed)
Ricky Freeman  03/19/2017      [id number] - [place], [place] - [address] [place] [address] Phone: [phone] Fax: [phone]  [id number] [place], [place]. AT [address] [place] [address] Phone: [phone] Fax: [phone]    Your procedure is scheduled on January 10  Report to [facility] at [place].M.  Call this number if you have problems the morning of surgery:  [phone]   Remember:  Do not eat food or drink liquids after midnight.  Continue all medications as directed by your physician except follow these medication instructions before surgery below   Take these medicines the morning of surgery with A SIP OF WATER  LORazepam (ATIVAN) if needed  7 days prior to surgery STOP taking any Aspirin(unless otherwise instructed by your surgeon), Aleve, Naproxen, Ibuprofen, Motrin, Advil, Goody's, BC's, all herbal medications, fish oil, and all vitamins   Do not wear jewelry  Do not wear lotions, powders, or cologne, or deodorant.  Men may shave face and neck.  Do not bring valuables to the hospital.  [facility] is not responsible for any belongings or valuables.  Contacts, dentures or bridgework may not be worn into surgery.  Leave your suitcase in the car.  After surgery it may be brought to your room.  For patients admitted to the hospital, discharge time will be determined by your treatment team.  Patients discharged the day of surgery will not be allowed to drive home.   Special instructions:   [facility]- Preparing For Surgery  Before surgery, you can play an important role. Because skin is not sterile, your skin needs to be as free of germs as possible. You can reduce the number of germs on your skin by washing with CHG (chlorahexidine gluconate) Soap before surgery.  CHG is an antiseptic cleaner which  kills germs and bonds with the skin to continue killing germs even after washing.  Please do not use if you have an allergy to CHG or antibacterial soaps. If your skin becomes reddened/irritated stop using the CHG.  Do not shave (including legs and underarms) for at least 48 hours prior to first CHG shower. It is OK to shave your face.  Please follow these instructions carefully.   1. Shower the NIGHT BEFORE SURGERY and the MORNING OF SURGERY with CHG.   2. If you chose to wash your hair, wash your hair first as usual with your normal shampoo.  3. After you shampoo, rinse your hair and body thoroughly to remove the shampoo.  4. Use CHG as you would any other liquid soap. You can apply CHG directly to the skin and wash gently with a scrungie or a clean washcloth.   5. Apply the CHG Soap to your body ONLY FROM THE NECK DOWN.  Do not use on open wounds or open sores. Avoid contact with your eyes, ears, mouth and genitals (private parts). Wash Face and genitals (private parts)  with your normal soap.  6. Wash thoroughly, paying special attention to the area where your surgery will be performed.  7. Thoroughly rinse your body with warm water from the neck down.  8. DO NOT shower/wash with your normal soap after using and rinsing off the CHG Soap.  9. Pat yourself dry with a CLEAN TOWEL.  10. Wear CLEAN PAJAMAS to bed the night before surgery, wear comfortable clothes  the morning of surgery  11. Place CLEAN SHEETS on your bed the night of your first shower and DO NOT SLEEP WITH PETS.    Day of Surgery: Do not apply any deodorants/lotions. Please wear clean clothes to the hospital/surgery center.      Please read over the following fact sheets that you were given.

## 2017-03-20 ENCOUNTER — Other Ambulatory Visit: Payer: Self-pay

## 2017-03-20 ENCOUNTER — Encounter (HOSPITAL_COMMUNITY)
Admission: RE | Admit: 2017-03-20 | Discharge: 2017-03-20 | Disposition: A | Discharge status: Home / Self Care | Payer: 59 | Source: Ambulatory Visit | Attending: Orthopedic Surgery | Admitting: Orthopedic Surgery

## 2017-03-20 ENCOUNTER — Encounter (HOSPITAL_COMMUNITY): Payer: Self-pay

## 2017-03-20 DIAGNOSIS — X58XXXA Exposure to other specified factors, initial encounter: Secondary | ICD-10-CM | POA: Diagnosis not present

## 2017-03-20 DIAGNOSIS — Z01812 Encounter for preprocedural laboratory examination: Secondary | ICD-10-CM | POA: Diagnosis present

## 2017-03-20 DIAGNOSIS — S4992XA Unspecified injury of left shoulder and upper arm, initial encounter: Secondary | ICD-10-CM | POA: Insufficient documentation

## 2017-03-20 DIAGNOSIS — Z01810 Encounter for preprocedural cardiovascular examination: Secondary | ICD-10-CM | POA: Diagnosis not present

## 2017-03-20 DIAGNOSIS — I447 Left bundle-branch block, unspecified: Secondary | ICD-10-CM | POA: Insufficient documentation

## 2017-03-20 HISTORY — DX: Unspecified osteoarthritis, unspecified site: M19.90

## 2017-03-20 LAB — BASIC METABOLIC PANEL
ANION GAP: 7 (ref 5–15)
BUN: 13 mg/dL (ref 6–20)
CALCIUM: 9.2 mg/dL (ref 8.9–10.3)
CO2: 26 mmol/L (ref 22–32)
Chloride: 103 mmol/L (ref 101–111)
Creatinine, Ser: 1.03 mg/dL (ref 0.61–1.24)
GFR calc non Af Amer: >60 mL/min (ref >60)
GLUCOSE: 105 mg/dL — AB (ref 65–99)
Potassium: 4.7 mmol/L (ref 3.5–5.1)
Sodium: 136 mmol/L (ref 135–145)

## 2017-03-20 LAB — CBC
HCT: 41.9 % (ref 39.0–52.0)
HEMOGLOBIN: 14 g/dL (ref 13.0–17.0)
MCH: 28.9 pg (ref 26.0–34.0)
MCHC: 33.4 g/dL (ref 30.0–36.0)
MCV: 86.6 fL (ref 78.0–100.0)
Platelets: 233 10*3/uL (ref 150–400)
RBC: 4.84 MIL/uL (ref 4.22–5.81)
RDW: 13.1 % (ref 11.5–15.5)
WBC: 7.4 10*3/uL (ref 4.0–10.5)

## 2017-03-20 LAB — SURGICAL PCR SCREEN
MRSA, PCR: NEGATIVE
Staphylococcus aureus: POSITIVE — AB

## 2017-03-20 NOTE — Progress Notes (Signed)
PCR positive for MSSA, negative for MRSA. Prescription called to pharmacy and message left for patient with results.

## 2017-03-20 NOTE — Progress Notes (Signed)
PCP - Alysia Penna Cardiologist - none currently per pt  EKG - 03/20/2017  Stress Test - 2015 ECHO - 2015  Anesthesia review: pt with hx LBBB, workup with Cardiology in 2015 with abnormal stress test.   Patient denies shortness of breath, fever, cough and chest pain at PAT appointment  Patient verbalized understanding of instructions that were given to them at the PAT appointment. Patient was also instructed that they will need to review over the PAT instructions again at home before surgery.

## 2017-03-23 ENCOUNTER — Encounter (HOSPITAL_COMMUNITY): Payer: Self-pay | Admitting: Vascular Surgery

## 2017-03-23 NOTE — Progress Notes (Addendum)
Anesthesia chart review: Patient is a 57 year old male scheduled for left total shoulder arthroplasty on 03/26/2017 by Dr. Justice Britain.  History includes never smoker, arthritis, left bundle branch block, tonsillectomy, varicocelectomy '03, Lasik, wisdom teeth extraction, left rotator cuff repair 12/06/15.   - PCP is Dr. Alysia Penna. Last visit 08/14/16. - Cardiologist is Dr. Fransico Him. First seen 08/03/13 for evaluation of abnormal EKG (left BBB). At that time, patient was lifting weights and doing cardio 4-5 times/week and biking 50 miles on the weekend. Stress test was intermediate risk (RCA territory scar, EF 41%), echo showed EF 50-55% with normal LVF. Coronary CT was ordered and showed calcium score of 0 with no definite obstructive CAD but images were limited. Last notations by Dr. Radford Pax seen on 09/14/13 stated, "Calcium score is 0 and there is no obvious plaque or obstructive lesion in his coronary arteries." One year follow-up was planned.  Meds include Ativan and Maxalt when necessary.  BP 115/72   Pulse 62   Temp 36.6 C   Resp 20   Ht 5\' 11"  (1.803 m)   Wt 194 lb (88 kg)   SpO2 99%   BMI 27.06 kg/m   EKG 03/20/17: NSR, left BBB. (Left BBB has been present since at least 08/02/13.)  CTA coronary 09/12/13:  - Coronary Arteries:  Left dominant with no anomalies - LM:  No plaque or stenosis. - LAD system: No plaque or stenosis noted. However, the distal LAD was poorly visualized. - Circumflex system: There was a moderate ramus. No plaque or stenosis noted but interpretation of the distal vessel was limited by misregistration artifact. There was a moderate OM1. No plaque or stenosis noted but interpretation of the distal vessel was limited by misregistration artifact. The AV LCx was a dominant vessel providing the left PDA. The distal LCx system was not well-visualized, there was misregistration artifact. In the mid LCx, cannot rule out mild stenosis with soft plaque. - RCA: Small  nondominant vessel. No plaque or stenosis noted. There was misregistration artifact present but did not appreciably inhibit interpretation of this vessel. IMPRESSION: 1. Coronary artery calcium score of 0 Agatston units, suggesting low risk of future cardiac events. 2. Left dominant system with no definite obstructive plaque or stenosis noted in the coronary tree. Interpretation was limited as above by significant misregistration artifact. Unfortunately, a faulty protocol was used and we were only able to generate images of 1 phase (rather than 3 phases).  Nuclear stress test 08/18/13:  Overall Impression:  Intermediate risk stress nuclear study with a large scar in the RCA territory, no ischemia. . LV Ejection Fraction: 41%.  LV Wall Motion:  Paradoxical septal motion and hypokinesis of the entire inferior, basal and mid inferoseptal walls.   Echo 08/18/13: Study Conclusions - Left ventricle: The cavity size was normal. Wall thickness was normal. Systolic function was normal. The estimated ejection fraction was in the range of 50% to 55%. Wall motion was normal; there were no regional wall motion abnormalities. - Left atrium: The atrium was mildly dilated.  Preoperative labs noted. CBC and BMET WNL except glucose 105.  Reviewed above history, prior cardiac testing with anesthesiologist Dr. Nolon Nations. Patient last seen by cardiology in 2015 with one year follow-up recommended. Patient will need pre-operative cardiology input. I have left a voice message with Velvet at Dr. Susie Cassette office.   [facility] Short Stay Center/Anesthesiology Phone [phone] 03/23/2017 5:03 PM  Addendum: Dr. Fransico Him saw patient on 03/25/17 for  preoperative cardiac clearance. She wrote:  1.  Chronic LBBB - coronary CTA in 2015 showed a calcium score of 0 and normal coronary anatomy.  2D echo showed low normal LVF with EF 50-55%.    2.  Left shoulder injury - needs preoperative  cardiac clearance.  He is scheduled for left total shoulder arthroplasty tomorrow.   He has not had any symptoms of angina and is able to complete exertional activities greater than 4 mets without any problems.  His revised cardiac risk estimate by Truman Hayward criteria for periop MI, CHF, ventricular arrhythmia or cardiac arrest is very low at 0.4%.  Ok to proceed with surgery.   [facility] Short Stay Center/Anesthesiology Phone [phone] 03/25/2017 12:05 PM

## 2017-03-24 NOTE — Progress Notes (Signed)
Cardiology Office Note:    Date:  03/25/2017   ID:  Ricky Freeman, DOB 1960-12-20, MRN [id number]  PCP:  Laurey Morale, MD  Cardiologist:  Fransico Him, MD   Referring MD: Laurey Morale, MD   Chief Complaint  Patient presents with  . New Patient (Initial Visit)    preoperative cardiac clearance    History of Present Illness:    Ricky Freeman is a 57 y.o. male with a hx of LBBB noted by EKG as far back as 2015 with coronary CTA showing no calcium and normal anatomy.  2D echo showed low normal LVF at 50-55%.  He is now here for preoperative cardiac clearance prior to undergoing shoulder surgery.   He denies any chest pain or pressure, SOB, DOE, PND, orthopnea, LE edema, dizziness, palpitations or syncope. He is compliant with his meds and is tolerating meds with no SE.  He runs 2 miles and bikes 3 miles 5 days weekly and then weight 4 days with no problems.    Past Medical History:  Diagnosis Date  . Arthritis   . Impingement syndrome of left shoulder 11/2015  . Left bundle branch block (LBBB) 2015   Coronary CTA with calcium score of 0 and normal coronary anatomy  . Rotator cuff strain 11/2015   left    Past Surgical History:  Procedure Laterality Date  . COLONOSCOPY WITH PROPOFOL  09/20/2015   per Dr. Hilarie Fredrickson, adenomatous polyps, repeat in 5 yrs   . LASIK Bilateral   . SHOULDER ARTHROSCOPY WITH ROTATOR CUFF REPAIR Left   . SHOULDER SURGERY    . TONSILLECTOMY    . VARICOCELECTOMY Bilateral 09/14/2001  . WISDOM TOOTH EXTRACTION      Current Medications: Current Meds  Medication Sig  . ibuprofen (ADVIL,MOTRIN) 200 MG tablet Take 800 mg by mouth daily as needed for moderate pain.  Marland Kitchen LORazepam (ATIVAN) 1 MG tablet Take 1 mg by mouth every 6 (six) hours as needed for anxiety.  . Multiple Vitamins-Minerals (MULTIVITAMIN,TX-MINERALS) tablet Take 1 tablet by mouth daily.    . naproxen sodium (ALEVE) 220 MG tablet Take 440 mg by mouth daily as needed (pain).  . rizatriptan  (MAXALT) 10 MG tablet Take 1 tablet (10 mg total) by mouth as needed for migraine. May repeat in 2 hours if needed     Allergies:   Patient has no known allergies.   Social History   Socioeconomic History  . Marital status: Divorced    Spouse name: None  . Number of children: None  . Years of education: None  . Highest education level: None  Social Needs  . Financial resource strain: None  . Food insecurity - worry: None  . Food insecurity - inability: None  . Transportation needs - medical: None  . Transportation needs - non-medical: None  Occupational History  . None  Tobacco Use  . Smoking status: Never Smoker  . Smokeless tobacco: Never Used  Substance and Sexual Activity  . Alcohol use: No  . Drug use: No  . Sexual activity: None  Other Topics Concern  . None  Social History Narrative  . None     Family History: The patient's family history includes Colon cancer (age of onset: 65) in his mother; Prostate cancer in his father.  ROS:   Please see the history of present illness.    ROS  All other systems reviewed and negative.   EKGs/Labs/Other Studies Reviewed:    The following studies were  reviewed today: none  EKG:  EKG is not ordered today.   Recent Labs: 08/14/2016: ALT 22; TSH 1.20 03/20/2017: BUN 13; Creatinine, Ser 1.03; Hemoglobin 14.0; Platelets 233; Potassium 4.7; Sodium 136   Recent Lipid Panel    Component Value Date/Time   CHOL 128 08/01/2015 0827   TRIG 72.0 08/01/2015 0827   HDL 43.50 08/01/2015 0827   CHOLHDL 3 08/01/2015 0827   VLDL 14.4 08/01/2015 0827   LDLCALC 70 08/01/2015 0827    Physical Exam:    VS:  BP 114/82   Pulse 61   Ht 5\' 11"  (1.803 m)   Wt 188 lb (85.3 kg)   SpO2 94%   BMI 26.22 kg/m     Wt Readings from Last 3 Encounters:  03/25/17 188 lb (85.3 kg)  03/20/17 194 lb (88 kg)  08/14/16 184 lb (83.5 kg)     GEN:  Well nourished, well developed in no acute distress HEENT: Normal NECK: No JVD; No carotid  bruits LYMPHATICS: No lymphadenopathy CARDIAC: RRR, no murmurs, rubs, gallops RESPIRATORY:  Clear to auscultation without rales, wheezing or rhonchi  ABDOMEN: Soft, non-tender, non-distended MUSCULOSKELETAL:  No edema; No deformity  SKIN: Warm and dry NEUROLOGIC:  Alert and oriented x 3 PSYCHIATRIC:  Normal affect   ASSESSMENT:    1. LBBB (left bundle branch block)   2. Preoperative clearance    PLAN:    In order of problems listed above:  1.  Chronic LBBB - coronary CTA in 2015 showed a calcium score of 0 and normal coronary anatomy.  2D echo showed low normal LVF with EF 50-55%.    2.  Left shoulder injury - needs preoperative cardiac clearance.  He is scheduled for left total shoulder arthroplasty tomorrow.   He has not had any symptoms of angina and is able to complete exertional activities greater than 4 mets without any problems.  His revised cardiac risk estimate by Truman Hayward criteria for periop MI, CHF, ventricular arrhythmia or cardiac arrest is very low at 0.4%.  Ok to proceed with surgery.    Medication Adjustments/Labs and Tests Ordered: Current medicines are reviewed at length with the patient today.  Concerns regarding medicines are outlined above.  No orders of the defined types were placed in this encounter.  No orders of the defined types were placed in this encounter.   Signed, Fransico Him, MD  03/25/2017 9:58 AM    [place]

## 2017-03-25 ENCOUNTER — Ambulatory Visit: Payer: 59 | Admitting: Cardiology

## 2017-03-25 ENCOUNTER — Encounter (HOSPITAL_COMMUNITY): Payer: Self-pay | Admitting: Certified Registered Nurse Anesthetist

## 2017-03-25 ENCOUNTER — Encounter (INDEPENDENT_AMBULATORY_CARE_PROVIDER_SITE_OTHER): Payer: Self-pay

## 2017-03-25 ENCOUNTER — Encounter: Payer: Self-pay | Admitting: Cardiology

## 2017-03-25 VITALS — BP 114/82 | HR 61 | Ht 71.0 in | Wt 188.0 lb

## 2017-03-25 DIAGNOSIS — I447 Left bundle-branch block, unspecified: Secondary | ICD-10-CM | POA: Diagnosis not present

## 2017-03-25 DIAGNOSIS — Z01818 Encounter for other preprocedural examination: Secondary | ICD-10-CM

## 2017-03-25 MED ORDER — TRANEXAMIC ACID 1000 MG/10ML IV SOLN
1000.0000 mg | INTRAVENOUS | Status: AC
  Administered 2017-03-26: 1000 mg via INTRAVENOUS
  Filled 2017-03-25: qty 1100

## 2017-03-25 NOTE — Patient Instructions (Signed)
Medication Instructions:  Your physician recommends that you continue on your current medications as directed. Please refer to the Current Medication list given to you today.  Labwork: None ordered   Testing/Procedures: None ordered   Follow-Up: Follow up as needed with Dr. Radford Pax.  Any Other Special Instructions Will Be Listed Below (If Applicable).     If you need a refill on your cardiac medications before your next appointment, please call your pharmacy.

## 2017-03-26 ENCOUNTER — Ambulatory Visit (HOSPITAL_COMMUNITY): Payer: 59 | Admitting: Vascular Surgery

## 2017-03-26 ENCOUNTER — Ambulatory Visit (HOSPITAL_COMMUNITY): Payer: 59 | Admitting: Certified Registered Nurse Anesthetist

## 2017-03-26 ENCOUNTER — Encounter (HOSPITAL_COMMUNITY)
Admission: RE | Disposition: A | Discharge status: Home / Self Care | Payer: Self-pay | Source: Ambulatory Visit | Attending: Orthopedic Surgery

## 2017-03-26 ENCOUNTER — Encounter (HOSPITAL_COMMUNITY): Payer: Self-pay | Admitting: Certified Registered"

## 2017-03-26 ENCOUNTER — Ambulatory Visit (HOSPITAL_COMMUNITY)
Admission: RE | Admit: 2017-03-26 | Discharge: 2017-03-27 | Disposition: A | Discharge status: Home / Self Care | Payer: 59 | Source: Ambulatory Visit | Attending: Orthopedic Surgery | Admitting: Orthopedic Surgery

## 2017-03-26 ENCOUNTER — Other Ambulatory Visit: Payer: Self-pay

## 2017-03-26 DIAGNOSIS — Z96612 Presence of left artificial shoulder joint: Secondary | ICD-10-CM

## 2017-03-26 DIAGNOSIS — I447 Left bundle-branch block, unspecified: Secondary | ICD-10-CM | POA: Diagnosis not present

## 2017-03-26 DIAGNOSIS — Z79899 Other long term (current) drug therapy: Secondary | ICD-10-CM | POA: Insufficient documentation

## 2017-03-26 DIAGNOSIS — Z85828 Personal history of other malignant neoplasm of skin: Secondary | ICD-10-CM | POA: Insufficient documentation

## 2017-03-26 DIAGNOSIS — M19012 Primary osteoarthritis, left shoulder: Secondary | ICD-10-CM | POA: Diagnosis not present

## 2017-03-26 DIAGNOSIS — G8918 Other acute postprocedural pain: Secondary | ICD-10-CM | POA: Diagnosis not present

## 2017-03-26 HISTORY — PX: TOTAL SHOULDER REPLACEMENT: SUR1217

## 2017-03-26 HISTORY — DX: Basal cell carcinoma of skin, unspecified: C44.91

## 2017-03-26 HISTORY — PX: TOTAL SHOULDER ARTHROPLASTY: SHX126

## 2017-03-26 SURGERY — ARTHROPLASTY, SHOULDER, TOTAL
Anesthesia: General | Laterality: Left

## 2017-03-26 MED ORDER — ONDANSETRON HCL 4 MG/2ML IJ SOLN: 4.0000 mg | Freq: Four times a day (QID) | INTRAMUSCULAR | Status: DC | PRN

## 2017-03-26 MED ORDER — KETOROLAC TROMETHAMINE 15 MG/ML IJ SOLN
INTRAMUSCULAR | Status: AC
  Filled 2017-03-26: qty 1

## 2017-03-26 MED ORDER — METOCLOPRAMIDE HCL 5 MG/ML IJ SOLN: 5.0000 mg | Freq: Three times a day (TID) | INTRAMUSCULAR | Status: DC | PRN

## 2017-03-26 MED ORDER — HYDROMORPHONE HCL 1 MG/ML IJ SOLN
1.0000 mg | INTRAMUSCULAR | Status: DC | PRN
  Administered 2017-03-27 (×2): 1 mg via INTRAVENOUS
  Filled 2017-03-26 (×2): qty 1

## 2017-03-26 MED ORDER — KETOROLAC TROMETHAMINE 15 MG/ML IJ SOLN
15.0000 mg | Freq: Four times a day (QID) | INTRAMUSCULAR | Status: AC
  Administered 2017-03-26 – 2017-03-27 (×4): 15 mg via INTRAVENOUS
  Filled 2017-03-26 (×3): qty 1

## 2017-03-26 MED ORDER — DIPHENHYDRAMINE HCL 12.5 MG/5ML PO ELIX: 12.5000 mg | ORAL_SOLUTION | ORAL | Status: DC | PRN

## 2017-03-26 MED ORDER — HYDROMORPHONE HCL 1 MG/ML IJ SOLN
INTRAMUSCULAR | Status: DC | PRN
  Administered 2017-03-26 (×2): .25 mg via INTRAVENOUS

## 2017-03-26 MED ORDER — ACETAMINOPHEN 650 MG RE SUPP: 650.0000 mg | RECTAL | Status: DC | PRN

## 2017-03-26 MED ORDER — DEXAMETHASONE SODIUM PHOSPHATE 4 MG/ML IJ SOLN
INTRAMUSCULAR | Status: DC | PRN
  Administered 2017-03-26: 10 mg via INTRAVENOUS

## 2017-03-26 MED ORDER — ROPIVACAINE HCL 5 MG/ML IJ SOLN
INTRAMUSCULAR | Status: DC | PRN
  Administered 2017-03-26: 30 mL via PERINEURAL

## 2017-03-26 MED ORDER — CEFAZOLIN SODIUM-DEXTROSE 2-4 GM/100ML-% IV SOLN
2.0000 g | Freq: Four times a day (QID) | INTRAVENOUS | Status: AC
  Administered 2017-03-26 – 2017-03-27 (×3): 2 g via INTRAVENOUS
  Filled 2017-03-26 (×4): qty 100

## 2017-03-26 MED ORDER — SIMETHICONE 80 MG PO CHEW: 160.0000 mg | CHEWABLE_TABLET | Freq: Four times a day (QID) | ORAL | Status: DC | PRN

## 2017-03-26 MED ORDER — PHENOL 1.4 % MT LIQD: 1.0000 | OROMUCOSAL | Status: DC | PRN

## 2017-03-26 MED ORDER — LACTATED RINGERS IV SOLN: INTRAVENOUS | Status: DC

## 2017-03-26 MED ORDER — CEFAZOLIN SODIUM-DEXTROSE 2-4 GM/100ML-% IV SOLN
2.0000 g | INTRAVENOUS | Status: AC
  Administered 2017-03-26: 2 g via INTRAVENOUS
  Filled 2017-03-26: qty 100

## 2017-03-26 MED ORDER — MENTHOL 3 MG MT LOZG: 1.0000 | LOZENGE | OROMUCOSAL | Status: DC | PRN

## 2017-03-26 MED ORDER — ONDANSETRON HCL 4 MG PO TABS
4.0000 mg | ORAL_TABLET | Freq: Four times a day (QID) | ORAL | Status: DC | PRN
Start: 2017-03-26 — End: 2017-03-27

## 2017-03-26 MED ORDER — ONDANSETRON HCL 4 MG/2ML IJ SOLN
INTRAMUSCULAR | Status: AC
  Filled 2017-03-26: qty 2

## 2017-03-26 MED ORDER — OXYCODONE HCL 5 MG PO TABS
5.0000 mg | ORAL_TABLET | ORAL | Status: DC | PRN
Start: 2017-03-26 — End: 2017-03-27

## 2017-03-26 MED ORDER — POLYETHYLENE GLYCOL 3350 17 G PO PACK: 17.0000 g | PACK | Freq: Every day | ORAL | Status: DC | PRN

## 2017-03-26 MED ORDER — PROPOFOL 10 MG/ML IV BOLUS
INTRAVENOUS | Status: DC | PRN
  Administered 2017-03-26: 200 mg via INTRAVENOUS

## 2017-03-26 MED ORDER — DOCUSATE SODIUM 100 MG PO CAPS
100.0000 mg | ORAL_CAPSULE | Freq: Two times a day (BID) | ORAL | Status: DC
  Administered 2017-03-26 – 2017-03-27 (×2): 100 mg via ORAL
  Filled 2017-03-26 (×2): qty 1

## 2017-03-26 MED ORDER — FENTANYL CITRATE (PF) 250 MCG/5ML IJ SOLN
INTRAMUSCULAR | Status: AC
  Filled 2017-03-26: qty 5

## 2017-03-26 MED ORDER — MIDAZOLAM HCL 5 MG/5ML IJ SOLN
INTRAMUSCULAR | Status: DC | PRN
  Administered 2017-03-26: 2 mg via INTRAVENOUS

## 2017-03-26 MED ORDER — LIDOCAINE 2% (20 MG/ML) 5 ML SYRINGE
INTRAMUSCULAR | Status: AC
  Filled 2017-03-26: qty 5

## 2017-03-26 MED ORDER — ROCURONIUM BROMIDE 10 MG/ML (PF) SYRINGE
PREFILLED_SYRINGE | INTRAVENOUS | Status: AC
  Filled 2017-03-26: qty 5

## 2017-03-26 MED ORDER — METOCLOPRAMIDE HCL 5 MG PO TABS: 5.0000 mg | ORAL_TABLET | Freq: Three times a day (TID) | ORAL | Status: DC | PRN

## 2017-03-26 MED ORDER — METOCLOPRAMIDE HCL 5 MG/ML IJ SOLN: 10.0000 mg | Freq: Once | INTRAMUSCULAR | Status: DC | PRN

## 2017-03-26 MED ORDER — ONDANSETRON HCL 4 MG/2ML IJ SOLN
INTRAMUSCULAR | Status: DC | PRN
  Administered 2017-03-26: 4 mg via INTRAVENOUS

## 2017-03-26 MED ORDER — CHLORHEXIDINE GLUCONATE 4 % EX LIQD: 60.0000 mL | Freq: Once | CUTANEOUS | Status: DC

## 2017-03-26 MED ORDER — ACETAMINOPHEN 325 MG PO TABS: 650.0000 mg | ORAL_TABLET | ORAL | Status: DC | PRN

## 2017-03-26 MED ORDER — DEXAMETHASONE SODIUM PHOSPHATE 10 MG/ML IJ SOLN
INTRAMUSCULAR | Status: AC
  Filled 2017-03-26: qty 1

## 2017-03-26 MED ORDER — FENTANYL CITRATE (PF) 100 MCG/2ML IJ SOLN
INTRAMUSCULAR | Status: DC | PRN
  Administered 2017-03-26: 100 ug via INTRAVENOUS
  Administered 2017-03-26: 150 ug via INTRAVENOUS

## 2017-03-26 MED ORDER — OXYCODONE HCL 5 MG PO TABS
ORAL_TABLET | ORAL | Status: AC
  Filled 2017-03-26: qty 2

## 2017-03-26 MED ORDER — 0.9 % SODIUM CHLORIDE (POUR BTL) OPTIME
TOPICAL | Status: DC | PRN
  Administered 2017-03-26: 1000 mL

## 2017-03-26 MED ORDER — EPHEDRINE SULFATE 50 MG/ML IJ SOLN
INTRAMUSCULAR | Status: DC | PRN
  Administered 2017-03-26 (×2): 10 mg via INTRAVENOUS
  Administered 2017-03-26: 5 mg via INTRAVENOUS
  Administered 2017-03-26: 10 mg via INTRAVENOUS
  Administered 2017-03-26: 5 mg via INTRAVENOUS
  Administered 2017-03-26 (×2): 10 mg via INTRAVENOUS

## 2017-03-26 MED ORDER — BISACODYL 5 MG PO TBEC: 5.0000 mg | DELAYED_RELEASE_TABLET | Freq: Every day | ORAL | Status: DC | PRN

## 2017-03-26 MED ORDER — DIAZEPAM 5 MG PO TABS
2.5000 mg | ORAL_TABLET | Freq: Four times a day (QID) | ORAL | Status: DC | PRN
  Administered 2017-03-26 – 2017-03-27 (×2): 5 mg via ORAL
  Filled 2017-03-26: qty 1

## 2017-03-26 MED ORDER — GLYCOPYRROLATE 0.2 MG/ML IJ SOLN
INTRAMUSCULAR | Status: DC | PRN
  Administered 2017-03-26: .8 mg via INTRAVENOUS
  Administered 2017-03-26: 0.2 mg via INTRAVENOUS

## 2017-03-26 MED ORDER — LACTATED RINGERS IV SOLN
INTRAVENOUS | Status: DC | PRN
  Administered 2017-03-26 (×2): via INTRAVENOUS

## 2017-03-26 MED ORDER — FLEET ENEMA 7-19 GM/118ML RE ENEM: 1.0000 | ENEMA | Freq: Once | RECTAL | Status: DC | PRN

## 2017-03-26 MED ORDER — SODIUM CHLORIDE 0.9 % IV SOLN
INTRAVENOUS | Status: DC | PRN
  Administered 2017-03-26: 25 ug/min via INTRAVENOUS

## 2017-03-26 MED ORDER — FENTANYL CITRATE (PF) 100 MCG/2ML IJ SOLN
INTRAMUSCULAR | Status: AC
  Administered 2017-03-26: 50 ug via INTRAVENOUS
  Filled 2017-03-26: qty 2

## 2017-03-26 MED ORDER — FENTANYL CITRATE (PF) 100 MCG/2ML IJ SOLN
25.0000 ug | INTRAMUSCULAR | Status: DC | PRN
  Administered 2017-03-26 (×2): 50 ug via INTRAVENOUS

## 2017-03-26 MED ORDER — HYDROMORPHONE HCL 1 MG/ML IJ SOLN
INTRAMUSCULAR | Status: AC
  Filled 2017-03-26: qty 0.5

## 2017-03-26 MED ORDER — MEPERIDINE HCL 25 MG/ML IJ SOLN: 6.2500 mg | INTRAMUSCULAR | Status: DC | PRN

## 2017-03-26 MED ORDER — NEOSTIGMINE METHYLSULFATE 5 MG/5ML IV SOSY
PREFILLED_SYRINGE | INTRAVENOUS | Status: AC
  Filled 2017-03-26: qty 5

## 2017-03-26 MED ORDER — MIDAZOLAM HCL 2 MG/2ML IJ SOLN
INTRAMUSCULAR | Status: AC
  Filled 2017-03-26: qty 2

## 2017-03-26 MED ORDER — OXYCODONE HCL 5 MG PO TABS
10.0000 mg | ORAL_TABLET | ORAL | Status: DC | PRN
  Administered 2017-03-26 – 2017-03-27 (×5): 10 mg via ORAL
  Filled 2017-03-26 (×4): qty 2

## 2017-03-26 MED ORDER — NEOSTIGMINE METHYLSULFATE 10 MG/10ML IV SOLN
INTRAVENOUS | Status: DC | PRN
  Administered 2017-03-26: 4 mg via INTRAVENOUS

## 2017-03-26 MED ORDER — PROPOFOL 10 MG/ML IV BOLUS
INTRAVENOUS | Status: AC
  Filled 2017-03-26: qty 40

## 2017-03-26 MED ORDER — DIAZEPAM 5 MG PO TABS
ORAL_TABLET | ORAL | Status: AC
  Filled 2017-03-26: qty 1

## 2017-03-26 MED ORDER — ROCURONIUM BROMIDE 100 MG/10ML IV SOLN
INTRAVENOUS | Status: DC | PRN
  Administered 2017-03-26: 80 mg via INTRAVENOUS

## 2017-03-26 MED ORDER — ALUM & MAG HYDROXIDE-SIMETH 200-200-20 MG/5ML PO SUSP: 30.0000 mL | ORAL | Status: DC | PRN

## 2017-03-26 SURGICAL SUPPLY — 57 items
ADH SKN CLS LQ APL DERMABOND (GAUZE/BANDAGES/DRESSINGS) ×1
AID PSTN UNV HD RSTRNT DISP (MISCELLANEOUS) ×1
BLADE SAW SGTL 83.5X18.5 (BLADE) ×2 IMPLANT
CEMENT BONE DEPUY (Cement) ×2 IMPLANT
COVER SURGICAL LIGHT HANDLE (MISCELLANEOUS) ×2 IMPLANT
DERMABOND ADHESIVE PROPEN (GAUZE/BANDAGES/DRESSINGS) ×1
DERMABOND ADVANCED .7 DNX6 (GAUZE/BANDAGES/DRESSINGS) ×1 IMPLANT
DRAPE ORTHO SPLIT 77X108 STRL (DRAPES) ×4
DRAPE SURG 17X11 SM STRL (DRAPES) ×2 IMPLANT
DRAPE SURG ORHT 6 SPLT 77X108 (DRAPES) ×2 IMPLANT
DRAPE U-SHAPE 47X51 STRL (DRAPES) ×2 IMPLANT
DRSG AQUACEL AG ADV 3.5X10 (GAUZE/BANDAGES/DRESSINGS) ×2 IMPLANT
DURAPREP 26ML APPLICATOR (WOUND CARE) ×2 IMPLANT
ELECT BLADE 4.0 EZ CLEAN MEGAD (MISCELLANEOUS) ×2
ELECT CAUTERY BLADE 6.4 (BLADE) ×2 IMPLANT
ELECT REM PT RETURN 9FT ADLT (ELECTROSURGICAL) ×2
ELECTRODE BLDE 4.0 EZ CLN MEGD (MISCELLANEOUS) ×1 IMPLANT
ELECTRODE REM PT RTRN 9FT ADLT (ELECTROSURGICAL) ×1 IMPLANT
FACESHIELD WRAPAROUND (MASK) ×6 IMPLANT
FACESHIELD WRAPAROUND OR TEAM (MASK) ×3 IMPLANT
GLENOID UNI VAULTLOCK LRG (Shoulder) ×1 IMPLANT
GLOVE BIO SURGEON STRL SZ7.5 (GLOVE) ×2 IMPLANT
GLOVE BIO SURGEON STRL SZ8 (GLOVE) ×2 IMPLANT
GLOVE EUDERMIC 7 POWDERFREE (GLOVE) ×2 IMPLANT
GLOVE SS BIOGEL STRL SZ 7.5 (GLOVE) ×1 IMPLANT
GLOVE SUPERSENSE BIOGEL SZ 7.5 (GLOVE) ×1
GOWN STRL REUS W/ TWL LRG LVL3 (GOWN DISPOSABLE) ×1 IMPLANT
GOWN STRL REUS W/ TWL XL LVL3 (GOWN DISPOSABLE) ×2 IMPLANT
GOWN STRL REUS W/TWL LRG LVL3 (GOWN DISPOSABLE) ×2
GOWN STRL REUS W/TWL XL LVL3 (GOWN DISPOSABLE) ×4
HEAD HUMERAL UNIVERS II 50/19 (Head) ×1 IMPLANT
KIT BASIN OR (CUSTOM PROCEDURE TRAY) ×2 IMPLANT
KIT ROOM TURNOVER OR (KITS) ×2 IMPLANT
KIT SET UNIVERSAL (KITS) ×2 IMPLANT
MANIFOLD NEPTUNE II (INSTRUMENTS) ×2 IMPLANT
NDL SUT .5 MAYO 1.404X.05X (NEEDLE) ×1 IMPLANT
NDL SUT 6 .5 CRC .975X.05 MAYO (NEEDLE) ×1 IMPLANT
NEEDLE MAYO TAPER (NEEDLE) ×4
NS IRRIG 1000ML POUR BTL (IV SOLUTION) ×2 IMPLANT
PACK SHOULDER (CUSTOM PROCEDURE TRAY) ×2 IMPLANT
PAD ARMBOARD 7.5X6 YLW CONV (MISCELLANEOUS) ×4 IMPLANT
RESTRAINT HEAD UNIVERSAL NS (MISCELLANEOUS) ×2 IMPLANT
SLING ARM FOAM STRAP LRG (SOFTGOODS) IMPLANT
SLING ARM XL FOAM STRAP (SOFTGOODS) ×2 IMPLANT
SMARTMIX MINI TOWER (MISCELLANEOUS) ×2
SPONGE LAP 18X18 X RAY DECT (DISPOSABLE) ×2 IMPLANT
SPONGE LAP 4X18 X RAY DECT (DISPOSABLE) ×2 IMPLANT
STEM HUMERAL UNIV APEX 13MM (Stem) ×1 IMPLANT
SUCTION FRAZIER HANDLE 10FR (MISCELLANEOUS) ×1
SUCTION TUBE FRAZIER 10FR DISP (MISCELLANEOUS) ×1 IMPLANT
SUT FIBERWIRE #2 38 T-5 BLUE (SUTURE) ×10
SUT MNCRL AB 3-0 PS2 18 (SUTURE) ×2 IMPLANT
SUT MON AB 2-0 CT1 36 (SUTURE) ×2 IMPLANT
SUT VIC AB 1 CT1 27 (SUTURE) ×2
SUT VIC AB 1 CT1 27XBRD ANBCTR (SUTURE) ×1 IMPLANT
SUTURE FIBERWR #2 38 T-5 BLUE (SUTURE) ×4 IMPLANT
TOWER SMARTMIX MINI (MISCELLANEOUS) ×1 IMPLANT

## 2017-03-26 NOTE — Plan of Care (Signed)
  Progressing Education: Knowledge of General Education information will improve 03/26/2017 1701 - Progressing by Rance Muir, RN Health Behavior/Discharge Planning: Ability to manage health-related needs will improve 03/26/2017 1701 - Progressing by Rance Muir, RN Clinical Measurements: Ability to maintain clinical measurements within normal limits will improve 03/26/2017 1701 - Progressing by Rance Muir, RN Will remain free from infection 03/26/2017 1701 - Progressing by Rance Muir, RN Diagnostic test results will improve 03/26/2017 1701 - Progressing by Rance Muir, RN Respiratory complications will improve 03/26/2017 1701 - Progressing by Rance Muir, RN Cardiovascular complication will be avoided 03/26/2017 1701 - Progressing by Rance Muir, RN Activity: Risk for activity intolerance will decrease 03/26/2017 1701 - Progressing by Rance Muir, RN Nutrition: Adequate nutrition will be maintained 03/26/2017 1701 - Progressing by Rance Muir, RN Coping: Level of anxiety will decrease 03/26/2017 1701 - Progressing by Rance Muir, RN Elimination: Will not experience complications related to bowel motility 03/26/2017 1701 - Progressing by Rance Muir, RN Will not experience complications related to urinary retention 03/26/2017 1701 - Progressing by Rance Muir, RN Pain Managment: General experience of comfort will improve 03/26/2017 1701 - Progressing by Rance Muir, RN Safety: Ability to remain free from injury will improve 03/26/2017 1701 - Progressing by Rance Muir, RN Skin Integrity: Risk for impaired skin integrity will decrease 03/26/2017 1701 - Progressing by Rance Muir, RN

## 2017-03-26 NOTE — Anesthesia Postprocedure Evaluation (Signed)
Anesthesia Post Note  Patient: Ricky Freeman  Procedure(s) Performed: TOTAL SHOULDER ARTHROPLASTY (Left )     Patient location during evaluation: PACU Anesthesia Type: General Level of consciousness: awake and alert Pain management: pain level controlled Vital Signs Assessment: post-procedure vital signs reviewed and stable Respiratory status: spontaneous breathing, nonlabored ventilation, respiratory function stable and patient connected to nasal cannula oxygen Cardiovascular status: blood pressure returned to baseline and stable Postop Assessment: no apparent nausea or vomiting Anesthetic complications: no    Last Vitals:  Vitals:   03/26/17 1200 03/26/17 1216  BP: 118/70 128/65  Pulse: 61 61  Resp: 20   Temp: 36.5 C 36.4 C  SpO2: 97% 97%    Last Pain:  Vitals:   03/26/17 1216  TempSrc: Oral  PainSc:                  [name]

## 2017-03-26 NOTE — Discharge Instructions (Signed)

## 2017-03-26 NOTE — Anesthesia Preprocedure Evaluation (Signed)
Anesthesia Evaluation  Patient identified by MRN, date of birth, ID band Patient awake    Reviewed: Allergy & Precautions, NPO status , Patient's Chart, lab work & pertinent test results  Airway Mallampati: II  TM Distance: >3 FB Neck ROM: Full    Dental no notable dental hx.    Pulmonary neg pulmonary ROS,    Pulmonary exam normal breath sounds clear to auscultation       Cardiovascular negative cardio ROS Normal cardiovascular exam Rhythm:Regular Rate:Normal  LBBB since 2015   Neuro/Psych negative neurological ROS  negative psych ROS   GI/Hepatic negative GI ROS, Neg liver ROS,   Endo/Other  negative endocrine ROS  Renal/GU negative Renal ROS  negative genitourinary   Musculoskeletal negative musculoskeletal ROS (+)   Abdominal   Peds negative pediatric ROS (+)  Hematology negative hematology ROS (+)   Anesthesia Other Findings   Reproductive/Obstetrics negative OB ROS                             Anesthesia Physical Anesthesia Plan  ASA: I  Anesthesia Plan: General   Post-op Pain Management:  Regional for Post-op pain   Induction: Intravenous  PONV Risk Score and Plan: 2 and Ondansetron and Dexamethasone  Airway Management Planned: Oral ETT  Additional Equipment:   Intra-op Plan:   Post-operative Plan: Extubation in OR  Informed Consent: I have reviewed the patients History and Physical, chart, labs and discussed the procedure including the risks, benefits and alternatives for the proposed anesthesia with the patient or authorized representative who has indicated his/her understanding and acceptance.   Dental advisory given  Plan Discussed with: CRNA  Anesthesia Plan Comments:         Anesthesia Quick Evaluation

## 2017-03-26 NOTE — Transfer of Care (Signed)
Immediate Anesthesia Transfer of Care Note  Patient: Ricky Freeman  Procedure(s) Performed: TOTAL SHOULDER ARTHROPLASTY (Left )  Patient Location: PACU  Anesthesia Type:General  Level of Consciousness: awake, alert  and oriented  Airway & Oxygen Therapy: Patient Spontanous Breathing and Patient connected to face mask oxygen  Post-op Assessment: Report given to RN and Post -op Vital signs reviewed and stable  Post vital signs: Reviewed and stable  Last Vitals:  Vitals:   03/26/17 0617  BP: (!) 165/88  Pulse: (!) 57  Resp: 20  Temp: 36.6 C  SpO2: 99%    Last Pain:  Vitals:   03/26/17 0632  TempSrc:   PainSc: 0-No pain      Patients Stated Pain Goal: 7 (05/27/79 1886)  Complications: No apparent anesthesia complications

## 2017-03-26 NOTE — Op Note (Signed)
03/26/2017  10:18 AM  PATIENT:   Ricky Freeman  57 y.o. male  PRE-OPERATIVE DIAGNOSIS:  Left shoulder osteoarthritis  POST-OPERATIVE DIAGNOSIS:  same  PROCEDURE:  L TSA #13 Stem, 50x19 eccentric head, large glenoid  SURGEON:  Rehema Muffley, Metta Clines M.D.  ASSISTANTS: Shuford pac   ANESTHESIA:   GET + ISB  EBL: 250   SPECIMEN:  none  Drains: none   PATIENT DISPOSITION:  PACU - hemodynamically stable.    PLAN OF CARE: Admit for overnight observation  Dictation# [id number]   Contact # [phone]

## 2017-03-26 NOTE — Anesthesia Procedure Notes (Signed)
Anesthesia Regional Block: Supraclavicular block   Pre-Anesthetic Checklist: ,, timeout performed, Correct Patient, Correct Site, Correct Laterality, Correct Procedure, Correct Position, site marked, Risks and benefits discussed,  Surgical consent,  Pre-op evaluation,  At surgeon's request and post-op pain management  Laterality: Left and Upper  Prep: Maximum Sterile Barrier Precautions used, chloraprep       Needles:  Injection technique: Single-shot  Needle Type: Echogenic Stimulator Needle     Needle Length: 10cm      Additional Needles:   Procedures:,,,, ultrasound used (permanent image in chart),,,,  Narrative:  Start time: 03/26/2017 7:10 AM End time: 03/26/2017 7:20 AM Injection made incrementally with aspirations every 5 mL.  Performed by: Personally  Anesthesiologist: [name], MD  Additional Notes: Risks, benefits and alternative to block explained extensively.  Patient tolerated procedure well, without complications.

## 2017-03-26 NOTE — H&P (Signed)
Ricky Freeman    Chief Complaint: Left shoulder osteoarthritis HPI: The patient is a 57 y.o. male with end stage left shoulder OA  Past Medical History:  Diagnosis Date  . Arthritis   . Impingement syndrome of left shoulder 11/2015  . Left bundle branch block (LBBB) 2015   Coronary CTA with calcium score of 0 and normal coronary anatomy  . Rotator cuff strain 11/2015   left    Past Surgical History:  Procedure Laterality Date  . COLONOSCOPY WITH PROPOFOL  09/20/2015   per Dr. Hilarie Fredrickson, adenomatous polyps, repeat in 5 yrs   . LASIK Bilateral   . SHOULDER ARTHROSCOPY WITH ROTATOR CUFF REPAIR Left   . SHOULDER SURGERY    . TONSILLECTOMY    . VARICOCELECTOMY Bilateral 09/14/2001  . WISDOM TOOTH EXTRACTION      Family History  Problem Relation Age of Onset  . Prostate cancer Father   . Colon cancer Mother 44       stage I colon ca with part colectomy    Social History:  reports that  has never smoked. he has never used smokeless tobacco. He reports that he does not drink alcohol or use drugs.   Medications Prior to Admission  Medication Sig Dispense Refill  . ibuprofen (ADVIL,MOTRIN) 200 MG tablet Take 800 mg by mouth daily as needed for moderate pain.    Marland Kitchen LORazepam (ATIVAN) 1 MG tablet Take 1 mg by mouth every 6 (six) hours as needed for anxiety.    . Multiple Vitamins-Minerals (MULTIVITAMIN,TX-MINERALS) tablet Take 1 tablet by mouth daily.      . naproxen sodium (ALEVE) 220 MG tablet Take 440 mg by mouth daily as needed (pain).    . rizatriptan (MAXALT) 10 MG tablet Take 1 tablet (10 mg total) by mouth as needed for migraine. May repeat in 2 hours if needed 10 tablet 11     Physical Exam: Left shoulder with painful and restricted motion as noted at recent office visits  Vitals  Temp:  [97.9 F (36.6 C)] 97.9 F (36.6 C) (01/10 0617) Pulse Rate:  [57-61] 57 (01/10 0617) Resp:  [20] 20 (01/10 0617) BP: (114-165)/(82-88) 165/88 (01/10 0617) SpO2:  [94 %-99 %] 99 %  (01/10 0617) Weight:  [85.3 kg (188 lb)] 85.3 kg (188 lb) (01/09 0949)  Assessment/Plan  Impression: Left shoulder osteoarthritis  Plan of Action: Procedure(s): TOTAL SHOULDER ARTHROPLASTY  Ricky Freeman Ricky Freeman 03/26/2017, 7:38 AM Contact # [phone]

## 2017-03-26 NOTE — Anesthesia Procedure Notes (Signed)
Procedure Name: Intubation Performed by: Verita Lamb, CRNA Pre-anesthesia Checklist: Patient identified, Emergency Drugs available, Suction available, Patient being monitored and Timeout performed Patient Re-evaluated:Patient Re-evaluated prior to induction Preoxygenation: Pre-oxygenation with 100% oxygen Induction Type: IV induction Ventilation: Mask ventilation without difficulty Laryngoscope Size: Mac and 3 Grade View: Grade I Tube type: Oral Tube size: 7.5 mm Number of attempts: 1 Airway Equipment and Method: Stylet Placement Confirmation: positive ETCO2,  ETT inserted through vocal cords under direct vision,  CO2 detector and breath sounds checked- equal and bilateral Secured at: 22 cm Tube secured with: Tape Dental Injury: Teeth and Oropharynx as per pre-operative assessment

## 2017-03-27 ENCOUNTER — Encounter (HOSPITAL_COMMUNITY): Payer: Self-pay | Admitting: Orthopedic Surgery

## 2017-03-27 DIAGNOSIS — M19012 Primary osteoarthritis, left shoulder: Secondary | ICD-10-CM | POA: Diagnosis not present

## 2017-03-27 MED ORDER — OXYCODONE-ACETAMINOPHEN 5-325 MG PO TABS: 1.0000 | ORAL_TABLET | ORAL | 0 refills | Status: DC | PRN

## 2017-03-27 MED ORDER — ONDANSETRON HCL 4 MG PO TABS: 4.0000 mg | ORAL_TABLET | Freq: Three times a day (TID) | ORAL | 0 refills | Status: DC | PRN

## 2017-03-27 MED ORDER — DIAZEPAM 5 MG PO TABS: 2.5000 mg | ORAL_TABLET | Freq: Four times a day (QID) | ORAL | 1 refills | Status: DC | PRN

## 2017-03-27 NOTE — Discharge Summary (Signed)
PATIENT ID:      Ricky Freeman  MRN:     [id number] DOB/AGE:    55/29/1962 / 57 y.o.     DISCHARGE SUMMARY  ADMISSION DATE:    03/26/2017 DISCHARGE DATE:    ADMISSION DIAGNOSIS: Left shoulder osteoarthritis Past Medical History:  Diagnosis Date  . Arthritis    "joints" (03/26/2017)  . Basal cell carcinoma 2000s   "burned off my chest"  . Impingement syndrome of left shoulder 11/2015  . Left bundle branch block (LBBB) 2015   Coronary CTA with calcium score of 0 and normal coronary anatomy  . Rotator cuff strain 11/2015   left    DISCHARGE DIAGNOSIS:   Active Problems:   Status post total shoulder arthroplasty, left   PROCEDURE: Procedure(s): TOTAL SHOULDER ARTHROPLASTY on 03/26/2017  CONSULTS:    HISTORY:  See H&P in chart.  HOSPITAL COURSE:  Ricky Freeman is a 57 y.o. admitted on 03/26/2017 with a diagnosis of Left shoulder osteoarthritis.  They were brought to the operating room on 03/26/2017 and underwent Procedure(s): TOTAL SHOULDER ARTHROPLASTY.    They were given perioperative antibiotics:  Anti-infectives (From admission, onward)   Start     Dose/Rate Route Frequency Ordered Stop   03/26/17 1400  ceFAZolin (ANCEF) IVPB 2g/100 mL premix     2 g 200 mL/hr over 30 Minutes Intravenous Every 6 hours 03/26/17 1057 03/27/17 0206   03/26/17 0620  ceFAZolin (ANCEF) IVPB 2g/100 mL premix     2 g 200 mL/hr over 30 Minutes Intravenous On call to O.R. 03/26/17 7062 03/26/17 0757    .  Patient underwent the above named procedure and tolerated it well. The following day they were hemodynamically stable and pain was controlled on oral analgesics. They were neurovascularly intact to the operative extremity. OT was ordered and worked with patient per protocol. They were medically and orthopaedically stable for discharge on day 1.    DIAGNOSTIC STUDIES:  RECENT RADIOGRAPHIC STUDIES :  No results found.  RECENT VITAL SIGNS:   Patient Vitals for the past 24 hrs:  BP Temp Temp  src Pulse Resp SpO2  03/27/17 0300 115/62 98.5 F (36.9 C) Oral (!) 57 18 95 %  03/26/17 2300 113/62 98.1 F (36.7 C) Oral 61 18 95 %  03/26/17 2100 110/61 98 F (36.7 C) Oral 66 18 95 %  03/26/17 1609 117/61 98.1 F (36.7 C) Oral 73 - 95 %  03/26/17 1216 128/65 97.6 F (36.4 C) Oral 61 - 97 %  03/26/17 1200 118/70 97.7 F (36.5 C) - 61 20 97 %  03/26/17 1145 121/72 - - (!) 51 17 99 %  03/26/17 1130 114/68 - - (!) 57 17 98 %  03/26/17 1115 126/74 - - (!) 54 14 97 %  03/26/17 1100 - - - 66 10 100 %  03/26/17 1045 120/82 - - 90 14 100 %  03/26/17 1038 120/84 97.6 F (36.4 C) - 86 16 100 %  .  RECENT EKG RESULTS:    Orders placed or performed during the hospital encounter of 03/20/17  . EKG 12-Lead  . EKG 12-Lead    DISCHARGE INSTRUCTIONS:    DISCHARGE MEDICATIONS:   Allergies as of 03/27/2017   No Known Allergies     Medication List    STOP taking these medications   ibuprofen 200 MG tablet Commonly known as:  ADVIL,MOTRIN   LORazepam 1 MG tablet Commonly known as:  ATIVAN     TAKE these medications  diazepam 5 MG tablet Commonly known as:  VALIUM Take 0.5-1 tablets (2.5-5 mg total) by mouth every 6 (six) hours as needed for muscle spasms or sedation.   multivitamin,tx-minerals tablet Take 1 tablet by mouth daily.   naproxen sodium 220 MG tablet Commonly known as:  ALEVE Take 440 mg by mouth daily as needed (pain).   ondansetron 4 MG tablet Commonly known as:  ZOFRAN Take 1 tablet (4 mg total) by mouth every 8 (eight) hours as needed for nausea or vomiting.   oxyCODONE-acetaminophen 5-325 MG tablet Commonly known as:  PERCOCET Take 1 tablet by mouth every 4 (four) hours as needed.   rizatriptan 10 MG tablet Commonly known as:  MAXALT Take 1 tablet (10 mg total) by mouth as needed for migraine. May repeat in 2 hours if needed       FOLLOW UP VISIT:   Follow-up Information    Justice Britain, MD.   Specialty:  Orthopedic Surgery Why:  call to be  seen in 10-14 days Contact information: [address] [place] [address] [phone]           DISCHARGE TO: Home   DISCHARGE CONDITION:  Festus Barren for Dr. Justice Britain 03/27/2017, 8:24 AM

## 2017-03-27 NOTE — Op Note (Signed)
NAMESELMA, Ricky Freeman NO.:  [id number]  MEDICAL RECORD NO.:  [id number]  LOCATION:                                 FACILITY:  PHYSICIAN:  Metta Clines. Arch Methot, M.D.       DATE OF BIRTH:  DATE OF PROCEDURE:  03/26/2017 DATE OF DISCHARGE:                              OPERATIVE REPORT   PREOPERATIVE DIAGNOSIS:  End-stage left shoulder osteoarthritis.  POSTOPERATIVE DIAGNOSIS:  End-stage left shoulder osteoarthritis.  PROCEDURE:  A left total shoulder arthroplasty utilizing a press-fit size 13 Arthrex stem with a 50 x 19 head and a large glenoid.  SURGEON:  Metta Clines. Ramah Langhans, MD.  Terrence DupontReather Laurence. Shuford, PA-C.  ANESTHESIA:  General endotracheal as well as interscalene block.  ESTIMATED BLOOD LOSS:  250 cc.  DRAINS:  None.  HISTORY:  Ricky Freeman is a 57 year old gentleman, who has been very active.  Has a remote history of left shoulder injury, which likely was instability, had a previous open anterior reconstruction, which is now progressed to end-stage osteoarthritis.  His plain radiographs confirmed multiple retained metallic suture anchors within the glenoid with marked deformity of the humeral head and complete loss of joint space.  He is brought to the operating room at this time for planned left total shoulder arthroplasty.  Preoperatively, I counseled Ricky Freeman regarding treatment options and potential risks versus benefits thereof.  Possible surgical complications were all reviewed including potential for bleeding, infection, neurovascular injury, persistent pain, loss of motion, anesthetic complication, failure of the implant, and possible need for additional surgery.  He understands and accepts and agrees with our planned procedure.  DESCRIPTION OF PROCEDURE:  After undergoing routine preop evaluation, the patient received prophylactic antibiotics.  An interscalene block was established in the holding area by the Anesthesia Department. Placed  supine on the operating table.  Underwent smooth induction of a general endotracheal anesthesia.  Placed in beach-chair position and appropriately padded and protected.  Left shoulder girdle region was sterilely prepped and draped in standard fashion.  Time-out was called. An anterior deltopectoral approach was made to the left shoulder through an 8 cm anterior incision.  Skin flaps were elevated.  Dissection again carried deeply.  Electrocautery was used for hemostasis.  He had a previous anterior surgery, so the deltopectoral interval was very scarred, we carefully divided this interval taking the vein laterally and this was then developed from proximal to distal.  We then divided the upper centimeter of the pectoralis major tendon to enhance our exposure.  The long-head biceps tendon was then tenodesed at the upper border of the pectoralis major tendon, was then tenotomized, excised and then removed proximally.  The conjoined tendon was then mobilized and retracted medially.  Divided adhesions beneath the deltoid.  At this point, the subscapularis was then tenotomized from the lesser tuberosity leaving approximately 1 to 1.5 cm cuff of tissue for later repair and the free margin was then tagged with a series of 6 #2 FiberWire grasping sutures.  We then divided the capsular attachments from the anteroinferior and inferior aspects of the humeral neck allowing delivery of the humeral head through the wound.  We outlined our  proposed humeral head resection using the extramedullary guide.  This was then performed with an oscillating saw maintaining the native retroversion approximately 30 degrees and carefully protecting the rotator cuff superiorly and posteriorly.  We then used a rongeur to remove the large osteophytes from the anterior and inferior margins of the humeral neck.  Humeral canal was then prepared by hand reaming size 7, broaching up to size 13 with excellent fit and fixation.   We then placed a 12 trial into the humeral canal with metal cap to protect the cut surface of the metaphysis and then exposed the glenoid with combination of Fukuda, pitchfork, and snake tongue retractors.  We performed a circumferential labral resection, gained complete visualization of the periphery of the glenoid and releasing the adhesions globally.  Guide pin was then placed into the center of the glenoid.  There was significant retroversion.  We corrected approximately 10 degrees of retroversion based on our guide pin placement and reamed the glenoid and did have to remove two of the previously placed metallic suture anchors, as we performed our reaming and ultimately obtained a stable subchondral bony bed for our glenoid implant.  We placed our central drill hole and then used the glenoid guide, placed the superior and inferior peg and slot respectively.  The glenoid was broached, irrigated, trial showed excellent fit and fixation.  Again, it was then meticulously cleaned and dried.  The cement was then mixed, introduced into the superior and inferior peg and slot respectively.  The large glenoid was then impacted into position with stable fixation much to our satisfaction.  We then returned our attention back to the proximal humerus, where the canal was cleaned. Our size 13 stem was then impacted with excellent press fit and fixation, proximal locking screws were then tightened.  We then performed a series of trial reductions.  We felt that the 50 x 19 eccentric head gave [place] the best soft tissue coverage, soft tissue balance and shoulder mobility with 50% translation of the humeral head and the glenoid.  The final 50 x 19 head was then impacted onto the [facility] taper.  Final reduction was then performed.  At this point, we completed mobilization of the subscapularis achieving good elasticity and the subscapularis was then repaired back to the lesser tuberosity using the 6 #2  FiberWire sutures passed in a horizontal mattress pattern and also used a pair of FiberWire to repair the rotator interval with pair of figure-of-eight #2 FiberWire sutures.  At this time, the arm easily allowed 30 degrees of external rotation with the arm at the side. The deltopectoral interval was then reapproximated with a series of figure-of-eight #1 Vicryl sutures, 2-0 Vicryl was used for subcutaneous layer, intracuticular 3-0 Monocryl for the skin, followed by Dermabond and Aquacel dressing.  Left arm was placed into a sling.  The patient was awakened, extubated and taken to the recovery room in stable condition.  Jenetta Loges, PA-C was used as an Environmental consultant throughout this case, was essential for help with positioning the patient, positioning the extremity, tissue manipulation, suture management, implantation of prosthesis, wound closure, and intraoperative decision making.     Metta Clines. Raeven Pint, M.D.   ______________________________ Metta Clines. Braxden Lovering, M.D.    KMS/MEDQ  D:  03/26/2017  T:  03/27/2017  Job:  [id number]

## 2017-03-27 NOTE — Evaluation (Signed)
Occupational Therapy Evaluation Patient Details Name: Ricky Freeman MRN: [id number] DOB: 1961-01-20 Today's Date: 03/27/2017    History of Present Illness Pt is a 57 y/o M s/p L TSA on 03/26/17    Clinical Impression   This 57 y/o M presents with the above. At baseline Pt is independent with ADLs and functional mobility. Pt completing functional mobility within room with MinGuard assist and no AD. Currently requires Mod-MaxA for UB ADLs secondary to LUE functional limitations. Education provided on shoulder precautions/protocol, safety, and compensatory techniques for completing ADLs while adhering to shoulder precautions with Pt verbalizing/return demonstrating understanding. Questions answered throughout. Pt will return home with fiance' who Pt reports is able to provide assist with ADLs PRN. Feel Pt is safe to return home once medically ready with follow up therapies as arranged by surgeon. No further acute OT needs identified at this time. Will sign off.     Follow Up Recommendations  DC plan and follow up therapy as arranged by surgeon;Supervision - Intermittent    Equipment Recommendations  None recommended by OT           Precautions / Restrictions Precautions Precautions: Shoulder Type of Shoulder Precautions: Passive Protocol: sling on except ADL/exercise, may come out of sling if in controlled environment, sleep in sling 0-4 wks post-op; NWB LUE; no AROM to L shoulder, AROM okay for e/w/h to tolerance; okay for gentle pendulums, okay to use operative UE to assist in feeding/bathing ADLs within PROM restrictions ER 0-20, ABD 0-45, FF 0-60 Shoulder Interventions: Shoulder sling/immobilizer;Off for dressing/bathing/exercises Precaution Booklet Issued: Yes (comment) Precaution Comments: issued and reviewed with Pt  Required Braces or Orthoses: Sling Restrictions Weight Bearing Restrictions: Yes LUE Weight Bearing: Non weight bearing      Mobility Bed Mobility Overal bed  mobility: Needs Assistance Bed Mobility: Supine to Sit;Sit to Supine     Supine to sit: HOB elevated;Supervision Sit to supine: HOB elevated;Supervision   General bed mobility comments: supervision for safety   Transfers Overall transfer level: Modified independent               General transfer comment: Pt able to complete sit<>stand using only RUE, supervision for safety, no physical assist needed     Balance Overall balance assessment: No apparent balance deficits (not formally assessed)                                         ADL either performed or assessed with clinical judgement   ADL Overall ADL's : Needs assistance/impaired Eating/Feeding: Set up;Sitting   Grooming: Min guard;Standing   Upper Body Bathing: Moderate assistance;Sitting   Lower Body Bathing: Minimal assistance;Sit to/from stand   Upper Body Dressing : Sitting;Maximal assistance;Adhering to UE precautions   Lower Body Dressing: Minimal assistance;Sit to/from stand   Toilet Transfer: Min guard;Ambulation;Regular Glass blower/designer Details (indicate cue type and reason): simulated in transfer to/from Ricky Freeman and Hygiene: Min guard;Sit to/from stand       Functional mobility during ADLs: Min guard General ADL Comments: education provided on shoulder protocol/precautions and safety/compensatory techniques for completing ADLs while adhering to precautions      Vision Baseline Vision/History: Wears glasses                  Pertinent Vitals/Pain Pain Assessment: Faces Faces Pain Scale: Hurts little more Pain Location: LUE  Pain Descriptors /  Indicators: Aching;Grimacing Pain Intervention(s): Premedicated before session;Monitored during session;Limited activity within patient's tolerance;Repositioned;Ice applied     Hand Dominance Right   Extremity/Trunk Assessment Upper Extremity Assessment Upper Extremity Assessment: LUE  deficits/detail LUE Deficits / Details: s/p L TSA  LUE: Unable to fully assess due to immobilization   Lower Extremity Assessment Lower Extremity Assessment: Overall WFL for tasks assessed   Cervical / Trunk Assessment Cervical / Trunk Assessment: Normal   Communication Communication Communication: No difficulties   Cognition Arousal/Alertness: Awake/alert Behavior During Therapy: WFL for tasks assessed/performed Overall Cognitive Status: Within Functional Limits for tasks assessed                                           Exercises Shoulder Exercises Pendulum Exercise: PROM;Left;Standing;10 reps Elbow Flexion: AROM;10 reps;Left;Standing Elbow Extension: AROM;10 reps;Left;Standing Wrist Flexion: AROM;10 reps;Left;Standing Wrist Extension: AROM;10 reps;Left;Standing Digit Composite Flexion: AROM;10 reps;Left;Standing Composite Extension: AROM;10 reps;Left;Standing Neck Flexion: AROM;Seated Neck Extension: AROM;Seated Neck Lateral Flexion - Right: AROM;Seated Neck Lateral Flexion - Left: AROM;Seated Hand Exercises Forearm Supination: AROM;5 reps;Left;Standing Forearm Pronation: AROM;5 reps;Left;Standing   Shoulder Instructions Shoulder Instructions Donning/doffing shirt without moving shoulder: Patient able to independently direct caregiver;Moderate assistance Method for sponge bathing under operated UE: Minimal assistance;Patient able to independently direct caregiver Donning/doffing sling/immobilizer: Maximal assistance;Patient able to independently direct caregiver Correct positioning of sling/immobilizer: Moderate assistance;Patient able to independently direct caregiver Pendulum exercises (written home exercise program): Min-guard ROM for elbow, wrist and digits of operated UE: Min-guard Sling wearing schedule (on at all times/off for ADL's): Modified independent Proper positioning of operated UE when showering: Min-guard Positioning of UE while  sleeping: Minimal assistance;Patient able to independently direct caregiver    Home Living Family/patient expects to be discharged to:: Private residence Living Arrangements: Children;Spouse/significant other(fiance') Available Help at Discharge: Family;Available PRN/intermittently Type of Home: House       Home Layout: Two level;Able to live on main level with bedroom/bathroom     Bathroom Shower/Tub: Occupational psychologist: Standard                Prior Functioning/Environment Level of Independence: Independent                 OT Problem List: Decreased activity tolerance;Impaired UE functional use;Pain;Decreased knowledge of precautions;Decreased range of motion            OT Goals(Current goals can be found in the care plan section) Acute Rehab OT Goals Patient Stated Goal: return home  OT Goal Formulation: All assessment and education complete, DC therapy                                 AM-PAC PT "6 Clicks" Daily Activity     Outcome Measure Help from another person eating meals?: None Help from another person taking care of personal grooming?: A Little Help from another person toileting, which includes using toliet, bedpan, or urinal?: A Little Help from another person bathing (including washing, rinsing, drying)?: A Little Help from another person to put on and taking off regular upper body clothing?: A Lot Help from another person to put on and taking off regular lower body clothing?: A Little 6 Click Score: 18   End of Session Equipment Utilized During Treatment: Other (comment)(sling ) Nurse Communication: Mobility status  Activity Tolerance: Patient tolerated  treatment well Patient left: in bed;with call bell/phone within reach  OT Visit Diagnosis: Muscle weakness (generalized) (M62.81);Pain Pain - Right/Left: Left Pain - part of body: Shoulder                Time: 4935-5217 OT Time Calculation (min): 27 min Charges:   OT General Charges $OT Visit: 1 Visit OT Evaluation $OT Eval Low Complexity: 1 Low OT Treatments $Self Care/Home Management : 8-22 mins G-Codes:     Lou Cal, OT Pager [phone] 03/27/2017   Raymondo Band 03/27/2017, 10:11 AM

## 2017-03-28 DIAGNOSIS — M19012 Primary osteoarthritis, left shoulder: Secondary | ICD-10-CM | POA: Diagnosis not present

## 2017-03-28 DIAGNOSIS — Z471 Aftercare following joint replacement surgery: Secondary | ICD-10-CM | POA: Diagnosis not present

## 2017-03-28 DIAGNOSIS — R600 Localized edema: Secondary | ICD-10-CM | POA: Diagnosis not present

## 2017-04-08 DIAGNOSIS — Z96651 Presence of right artificial knee joint: Secondary | ICD-10-CM | POA: Diagnosis not present

## 2017-04-09 DIAGNOSIS — M25612 Stiffness of left shoulder, not elsewhere classified: Secondary | ICD-10-CM | POA: Diagnosis not present

## 2017-04-14 DIAGNOSIS — M25612 Stiffness of left shoulder, not elsewhere classified: Secondary | ICD-10-CM | POA: Diagnosis not present

## 2017-04-16 DIAGNOSIS — M25612 Stiffness of left shoulder, not elsewhere classified: Secondary | ICD-10-CM | POA: Diagnosis not present

## 2017-04-16 DIAGNOSIS — R600 Localized edema: Secondary | ICD-10-CM | POA: Diagnosis not present

## 2017-04-16 DIAGNOSIS — M19012 Primary osteoarthritis, left shoulder: Secondary | ICD-10-CM | POA: Diagnosis not present

## 2017-04-16 DIAGNOSIS — Z471 Aftercare following joint replacement surgery: Secondary | ICD-10-CM | POA: Diagnosis not present

## 2017-04-21 DIAGNOSIS — M25612 Stiffness of left shoulder, not elsewhere classified: Secondary | ICD-10-CM | POA: Diagnosis not present

## 2017-04-23 DIAGNOSIS — M25612 Stiffness of left shoulder, not elsewhere classified: Secondary | ICD-10-CM | POA: Diagnosis not present

## 2017-04-24 DIAGNOSIS — H5202 Hypermetropia, left eye: Secondary | ICD-10-CM | POA: Diagnosis not present

## 2017-04-28 DIAGNOSIS — M25612 Stiffness of left shoulder, not elsewhere classified: Secondary | ICD-10-CM | POA: Diagnosis not present

## 2017-04-30 DIAGNOSIS — M25612 Stiffness of left shoulder, not elsewhere classified: Secondary | ICD-10-CM | POA: Diagnosis not present

## 2017-05-05 DIAGNOSIS — M25612 Stiffness of left shoulder, not elsewhere classified: Secondary | ICD-10-CM | POA: Diagnosis not present

## 2017-05-07 DIAGNOSIS — M25612 Stiffness of left shoulder, not elsewhere classified: Secondary | ICD-10-CM | POA: Diagnosis not present

## 2017-05-12 DIAGNOSIS — M25612 Stiffness of left shoulder, not elsewhere classified: Secondary | ICD-10-CM | POA: Diagnosis not present

## 2017-05-14 DIAGNOSIS — M25612 Stiffness of left shoulder, not elsewhere classified: Secondary | ICD-10-CM | POA: Diagnosis not present

## 2017-05-19 ENCOUNTER — Other Ambulatory Visit: Payer: Self-pay | Admitting: Family Medicine

## 2017-05-19 DIAGNOSIS — M25612 Stiffness of left shoulder, not elsewhere classified: Secondary | ICD-10-CM | POA: Diagnosis not present

## 2017-05-20 NOTE — Telephone Encounter (Signed)
Call in #60 with 5 rf 

## 2017-05-20 NOTE — Telephone Encounter (Signed)
Last OV 08/14/2016   Medication is NOT listed on pt's current medication list. Sent to PCP for approval

## 2017-05-21 DIAGNOSIS — M25612 Stiffness of left shoulder, not elsewhere classified: Secondary | ICD-10-CM | POA: Diagnosis not present

## 2017-05-26 DIAGNOSIS — M25612 Stiffness of left shoulder, not elsewhere classified: Secondary | ICD-10-CM | POA: Diagnosis not present

## 2017-06-02 DIAGNOSIS — M25612 Stiffness of left shoulder, not elsewhere classified: Secondary | ICD-10-CM | POA: Diagnosis not present

## 2017-06-04 DIAGNOSIS — M25612 Stiffness of left shoulder, not elsewhere classified: Secondary | ICD-10-CM | POA: Diagnosis not present

## 2017-06-09 DIAGNOSIS — M25612 Stiffness of left shoulder, not elsewhere classified: Secondary | ICD-10-CM | POA: Diagnosis not present

## 2017-06-11 DIAGNOSIS — M25612 Stiffness of left shoulder, not elsewhere classified: Secondary | ICD-10-CM | POA: Diagnosis not present

## 2017-06-16 DIAGNOSIS — M25612 Stiffness of left shoulder, not elsewhere classified: Secondary | ICD-10-CM | POA: Diagnosis not present

## 2017-06-18 DIAGNOSIS — M25612 Stiffness of left shoulder, not elsewhere classified: Secondary | ICD-10-CM | POA: Diagnosis not present

## 2017-06-22 DIAGNOSIS — Z471 Aftercare following joint replacement surgery: Secondary | ICD-10-CM | POA: Diagnosis not present

## 2017-06-22 DIAGNOSIS — M19011 Primary osteoarthritis, right shoulder: Secondary | ICD-10-CM | POA: Diagnosis not present

## 2017-06-23 DIAGNOSIS — M25612 Stiffness of left shoulder, not elsewhere classified: Secondary | ICD-10-CM | POA: Diagnosis not present

## 2017-06-25 DIAGNOSIS — M25612 Stiffness of left shoulder, not elsewhere classified: Secondary | ICD-10-CM | POA: Diagnosis not present

## 2017-06-30 DIAGNOSIS — M25612 Stiffness of left shoulder, not elsewhere classified: Secondary | ICD-10-CM | POA: Diagnosis not present

## 2017-07-02 DIAGNOSIS — M25612 Stiffness of left shoulder, not elsewhere classified: Secondary | ICD-10-CM | POA: Diagnosis not present

## 2017-07-07 DIAGNOSIS — M25612 Stiffness of left shoulder, not elsewhere classified: Secondary | ICD-10-CM | POA: Diagnosis not present

## 2017-07-08 ENCOUNTER — Telehealth: Payer: Self-pay | Admitting: Family Medicine

## 2017-07-08 NOTE — Telephone Encounter (Signed)
Copied from [place] #[id number]. Topic: Quick Communication - Rx Refill/Question >> Jul 08, 2017 10:38 AM Ether Griffins B wrote: Medication: triamcinolone cream (KENALOG) 0.1 %   Has the patient contacted their pharmacy? Yes.   (Agent: If no, request that the patient contact the pharmacy for the refill.)  Preferred Pharmacy (with phone number or street name): [id number] [place], [address]. AT [address] Agent: Please be advised that RX refills may take up to 3 business days. We ask that you follow-up with your pharmacy.

## 2017-07-09 DIAGNOSIS — M25612 Stiffness of left shoulder, not elsewhere classified: Secondary | ICD-10-CM | POA: Diagnosis not present

## 2017-07-09 NOTE — Telephone Encounter (Signed)
Pt states that he was exposed to poison ivy on Sunday and it is around ankles. Pt states nothing over the counter has worked and has been treated with Triamcinolone Cream (Kenalog) 0.1% for the past couple of years to treat poison ivy. Prescription is not on current medication list.   LOV: 08/14/16  PCP[facility]

## 2017-07-13 MED ORDER — TRIAMCINOLONE 0.1 % CREAM:EUCERIN CREAM 1:1: TOPICAL_CREAM | CUTANEOUS | 0 refills | Status: DC

## 2017-07-13 NOTE — Telephone Encounter (Signed)
Last seen for annual on 08/14/16.  Please advise refill of triamcinolone cream for poison ivy.

## 2017-07-13 NOTE — Telephone Encounter (Signed)
Call in Triamcinolone 0.1% cream to apply TID prn rash, 30 grams with 2 rf

## 2017-07-13 NOTE — Telephone Encounter (Signed)
Pt. Is calling about prescription  [id number] [place] [place] [address]  [place]. [place] [address]  Phone: [phone] Fax: [phone]

## 2017-07-14 DIAGNOSIS — M25612 Stiffness of left shoulder, not elsewhere classified: Secondary | ICD-10-CM | POA: Diagnosis not present

## 2017-07-16 DIAGNOSIS — M25612 Stiffness of left shoulder, not elsewhere classified: Secondary | ICD-10-CM | POA: Diagnosis not present

## 2017-07-21 DIAGNOSIS — M25612 Stiffness of left shoulder, not elsewhere classified: Secondary | ICD-10-CM | POA: Diagnosis not present

## 2017-07-23 DIAGNOSIS — M25612 Stiffness of left shoulder, not elsewhere classified: Secondary | ICD-10-CM | POA: Diagnosis not present

## 2017-08-17 ENCOUNTER — Ambulatory Visit (INDEPENDENT_AMBULATORY_CARE_PROVIDER_SITE_OTHER): Payer: 59 | Admitting: Family Medicine

## 2017-08-17 ENCOUNTER — Encounter: Payer: Self-pay | Admitting: Family Medicine

## 2017-08-17 VITALS — BP 122/78 | HR 69 | Temp 97.9°F | Ht 71.0 in | Wt 188.0 lb

## 2017-08-17 DIAGNOSIS — R0781 Pleurodynia: Secondary | ICD-10-CM | POA: Diagnosis not present

## 2017-08-17 DIAGNOSIS — M67432 Ganglion, left wrist: Secondary | ICD-10-CM | POA: Diagnosis not present

## 2017-08-17 DIAGNOSIS — Z0000 Encounter for general adult medical examination without abnormal findings: Secondary | ICD-10-CM

## 2017-08-17 DIAGNOSIS — Z0001 Encounter for general adult medical examination with abnormal findings: Secondary | ICD-10-CM

## 2017-08-17 LAB — CBC WITH DIFFERENTIAL/PLATELET
BASOS PCT: 1 % (ref 0.0–3.0)
Basophils Absolute: 0 10*3/uL (ref 0.0–0.1)
EOS PCT: 1.5 % (ref 0.0–5.0)
Eosinophils Absolute: 0.1 10*3/uL (ref 0.0–0.7)
HCT: 40.4 % (ref 39.0–52.0)
Hemoglobin: 14.1 g/dL (ref 13.0–17.0)
LYMPHS ABS: 1.8 10*3/uL (ref 0.7–4.0)
Lymphocytes Relative: 38.6 % (ref 12.0–46.0)
MCHC: 34.9 g/dL (ref 30.0–36.0)
MCV: 86.1 fl (ref 78.0–100.0)
MONO ABS: 0.4 10*3/uL (ref 0.1–1.0)
MONOS PCT: 9.6 % (ref 3.0–12.0)
Neutro Abs: 2.3 10*3/uL (ref 1.4–7.7)
Neutrophils Relative %: 49.3 % (ref 43.0–77.0)
Platelets: 258 10*3/uL (ref 150.0–400.0)
RBC: 4.7 Mil/uL (ref 4.22–5.81)
RDW: 14.1 % (ref 11.5–15.5)
WBC: 4.6 10*3/uL (ref 4.0–10.5)

## 2017-08-17 LAB — POC URINALSYSI DIPSTICK (AUTOMATED)
Bilirubin, UA: NEGATIVE
Blood, UA: NEGATIVE
GLUCOSE UA: NEGATIVE
Ketones, UA: NEGATIVE
Leukocytes, UA: NEGATIVE
NITRITE UA: NEGATIVE
PROTEIN UA: NEGATIVE
Spec Grav, UA: 1.015 (ref 1.010–1.025)
UROBILINOGEN UA: 0.2 U/dL
pH, UA: 6 (ref 5.0–8.0)

## 2017-08-17 LAB — BASIC METABOLIC PANEL
BUN: 14 mg/dL (ref 6–23)
CHLORIDE: 105 meq/L (ref 96–112)
CO2: 27 meq/L (ref 19–32)
Calcium: 9 mg/dL (ref 8.4–10.5)
Creatinine, Ser: 1.15 mg/dL (ref 0.40–1.50)
GFR: 70.16 mL/min (ref >60.00)
Glucose, Bld: 86 mg/dL (ref 70–99)
POTASSIUM: 4.3 meq/L (ref 3.5–5.1)
SODIUM: 139 meq/L (ref 135–145)

## 2017-08-17 LAB — HEPATIC FUNCTION PANEL
ALK PHOS: 85 U/L (ref 39–117)
ALT: 17 U/L (ref 0–53)
AST: 19 U/L (ref 0–37)
Albumin: 4.1 g/dL (ref 3.5–5.2)
BILIRUBIN TOTAL: 1 mg/dL (ref 0.2–1.2)
Bilirubin, Direct: 0.2 mg/dL (ref 0.0–0.3)
Total Protein: 6.3 g/dL (ref 6.0–8.3)

## 2017-08-17 LAB — PSA: PSA: 2.24 ng/mL (ref 0.10–4.00)

## 2017-08-17 LAB — TSH: TSH: 1.52 u[IU]/mL (ref 0.35–4.50)

## 2017-08-17 NOTE — Progress Notes (Signed)
   Subjective:    Patient ID: Ricky Freeman, male    DOB: January 12, 1961, 57 y.o.   MRN: [id number]  HPI Here for a well exam. He has a few issues to discuss. First about 10 weeks ago while pulling himself up on some bleachers he felt a sharp pain in the right ribs, and the pain has persisted since then (although improving). He has had a lump on the ribs since then. Also for 6 weeks he has had a lump on the left wrist. This does not bother him at all.    Review of Systems  Constitutional: Negative.   HENT: Negative.   Eyes: Negative.   Respiratory: Negative.   Cardiovascular: Positive for chest pain. Negative for palpitations and leg swelling.  Gastrointestinal: Negative.   Genitourinary: Negative.   Musculoskeletal: Negative.   Skin: Negative.   Neurological: Negative.   Psychiatric/Behavioral: Negative.        Objective:   Physical Exam  Constitutional: He is oriented to person, place, and time. He appears well-developed and well-nourished. No distress.  HENT:  Head: Normocephalic and atraumatic.  Right Ear: External ear normal.  Left Ear: External ear normal.  Nose: Nose normal.  Mouth/Throat: Oropharynx is clear and moist. No oropharyngeal exudate.  Eyes: Pupils are equal, round, and reactive to light. Conjunctivae and EOM are normal. Right eye exhibits no discharge. Left eye exhibits no discharge. No scleral icterus.  Neck: Neck supple. No JVD present. No tracheal deviation present. No thyromegaly present.  Cardiovascular: Normal rate, regular rhythm, normal heart sounds and intact distal pulses. Exam reveals no gallop and no friction rub.  No murmur heard. Pulmonary/Chest: Effort normal and breath sounds normal. No respiratory distress. He has no wheezes. He has no rales.  The right lateral lower ribs have a large firm mass protruding outward, no tenderness   Abdominal: Soft. Bowel sounds are normal. He exhibits no distension and no mass. There is no tenderness. There is no  rebound and no guarding.  Genitourinary: Rectum normal, prostate normal and penis normal. Rectal exam shows guaiac negative stool. No penile tenderness.  Musculoskeletal: Normal range of motion. He exhibits no edema or tenderness.  The left ulnar wrist has a small mobile nontender cystic mass   Lymphadenopathy:    He has no cervical adenopathy.  Neurological: He is alert and oriented to person, place, and time. He has normal reflexes. He displays normal reflexes. No cranial nerve deficit. He exhibits normal muscle tone. Coordination normal.  Skin: Skin is warm and dry. No rash noted. He is not diaphoretic. No erythema. No pallor.  Psychiatric: He has a normal mood and affect. His behavior is normal. Judgment and thought content normal.          Assessment & Plan:  Well exam. We discussed diet and exercise. Get labs today. He has injured the right ribs, and we will send him for Xrays to evaluate this further. The left wrist has a small ganglion cyst. We will observe this only for now.  Alysia Penna, MD

## 2017-08-27 ENCOUNTER — Ambulatory Visit (INDEPENDENT_AMBULATORY_CARE_PROVIDER_SITE_OTHER)
Admission: RE | Admit: 2017-08-27 | Discharge: 2017-08-27 | Disposition: A | Discharge status: Home / Self Care | Payer: 59 | Source: Ambulatory Visit | Attending: Family Medicine | Admitting: Family Medicine

## 2017-08-27 ENCOUNTER — Telehealth: Payer: Self-pay | Admitting: Family Medicine

## 2017-08-27 DIAGNOSIS — R0781 Pleurodynia: Secondary | ICD-10-CM | POA: Diagnosis not present

## 2017-08-27 NOTE — Telephone Encounter (Signed)
Copied from [place] [id number]. Topic: Quick Communication - Lab Results >> Aug 27, 2017  1:09 PM Myriam Forehand, [place] wrote: Called patient to inform them of  lab results. When patient returns call, triage nurse may disclose results.  Patient returning a missed call about his results CB # [phone]

## 2017-08-27 NOTE — Telephone Encounter (Signed)
RESULT NOTES GIVEN TO PATIENT FROM THE [facility] RESULT NOTES QUE

## 2017-08-28 NOTE — Telephone Encounter (Signed)
He has questions on the results he was given.  Asking to have Dr. Sarajane Jews or his nurse to please call him [phone] c [phone] w

## 2017-08-28 NOTE — Telephone Encounter (Signed)
X-ray results reviewed with patient. Notified him that no fractures or lesions were seen. Patient was confused because he did not have the x-ray for concern of fracture but for concern over a rib sticking out. He was appreciative of the call.

## 2017-09-09 DIAGNOSIS — M19022 Primary osteoarthritis, left elbow: Secondary | ICD-10-CM | POA: Diagnosis not present

## 2017-11-17 NOTE — Pre-Procedure Instructions (Signed)
LORIN GAWRON  11/17/2017      [id number] - [place], [place] [address] [place] [address] Phone: [phone] Fax: [phone]  [id number] [place], [place]. AT [address] [address] [address] [place] [address] Phone: [phone] Fax: [phone]    Your procedure is scheduled on September 12  Report to [facility] at [place].M.  Call this number if you have problems the morning of surgery:  [phone]   Remember:  Do not eat or drink after midnight.    Take these medicines the morning of surgery with A SIP OF WATER  LORazepam (ATIVAN) if needed  7 days prior to surgery STOP taking any Aspirin(unless otherwise instructed by your surgeon), Aleve, Naproxen, Ibuprofen, Motrin, Advil, Goody's, BC's, all herbal medications, fish oil, and all vitamins     Do not wear jewelry  Do not wear lotions, powders, or cologne, or deodorant.  Men may shave face and neck.  Do not bring valuables to the hospital.  [facility] is not responsible for any belongings or valuables.  Contacts, dentures or bridgework may not be worn into surgery.  Leave your suitcase in the car.  After surgery it may be brought to your room.  For patients admitted to the hospital, discharge time will be determined by your treatment team.  Patients discharged the day of surgery will not be allowed to drive home.    Special instructions:   [facility]- Preparing For Surgery  Before surgery, you can play an important role. Because skin is not sterile, your skin needs to be as free of germs as possible. You can reduce the number of germs on your skin by washing with CHG (chlorahexidine gluconate) Soap before surgery.  CHG is an antiseptic cleaner which kills germs and bonds with the skin to continue killing germs even after washing.    Oral Hygiene is also important to reduce  your risk of infection.  Remember - BRUSH YOUR TEETH THE MORNING OF SURGERY WITH YOUR REGULAR TOOTHPASTE  Please do not use if you have an allergy to CHG or antibacterial soaps. If your skin becomes reddened/irritated stop using the CHG.  Do not shave (including legs and underarms) for at least 48 hours prior to first CHG shower. It is OK to shave your face.  Please follow these instructions carefully.   1. Shower the NIGHT BEFORE SURGERY and the MORNING OF SURGERY with CHG.   2. If you chose to wash your hair, wash your hair first as usual with your normal shampoo.  3. After you shampoo, rinse your hair and body thoroughly to remove the shampoo.  4. Use CHG as you would any other liquid soap. You can apply CHG directly to the skin and wash gently with a scrungie or a clean washcloth.   5. Apply the CHG Soap to your body ONLY FROM THE NECK DOWN.  Do not use on open wounds or open sores. Avoid contact with your eyes, ears, mouth and genitals (private parts). Wash Face and genitals (private parts)  with your normal soap.  6. Wash thoroughly, paying special attention to the area where your surgery will be performed.  7. Thoroughly rinse your body with warm water from the neck down.  8. DO NOT shower/wash with your normal soap after using and rinsing off the CHG Soap.  9. Pat yourself dry with a CLEAN TOWEL.  10. Wear CLEAN PAJAMAS to bed the night before surgery, wear comfortable clothes the morning of surgery  11. Place CLEAN SHEETS on your bed the night of your first shower and DO NOT SLEEP WITH PETS.    Day of Surgery:  Do not apply any deodorants/lotions.  Please wear clean clothes to the hospital/surgery center.   Remember to brush your teeth WITH YOUR REGULAR TOOTHPASTE.    Please read over the following fact sheets that you were given.

## 2017-11-18 ENCOUNTER — Encounter (HOSPITAL_COMMUNITY)
Admission: RE | Admit: 2017-11-18 | Discharge: 2017-11-18 | Disposition: A | Discharge status: Home / Self Care | Payer: 59 | Source: Ambulatory Visit | Attending: Orthopedic Surgery | Admitting: Orthopedic Surgery

## 2017-11-18 ENCOUNTER — Other Ambulatory Visit: Payer: Self-pay

## 2017-11-18 ENCOUNTER — Other Ambulatory Visit: Payer: Self-pay | Admitting: Family Medicine

## 2017-11-18 DIAGNOSIS — Z79899 Other long term (current) drug therapy: Secondary | ICD-10-CM | POA: Insufficient documentation

## 2017-11-18 DIAGNOSIS — Z01812 Encounter for preprocedural laboratory examination: Secondary | ICD-10-CM | POA: Insufficient documentation

## 2017-11-18 DIAGNOSIS — M19011 Primary osteoarthritis, right shoulder: Secondary | ICD-10-CM | POA: Diagnosis not present

## 2017-11-18 LAB — SURGICAL PCR SCREEN
MRSA, PCR: NEGATIVE
STAPHYLOCOCCUS AUREUS: NEGATIVE

## 2017-11-18 LAB — BASIC METABOLIC PANEL
ANION GAP: 9 (ref 5–15)
BUN: 15 mg/dL (ref 6–20)
CALCIUM: 8.8 mg/dL — AB (ref 8.9–10.3)
CO2: 23 mmol/L (ref 22–32)
Chloride: 106 mmol/L (ref 98–111)
Creatinine, Ser: 1.31 mg/dL — ABNORMAL HIGH (ref 0.61–1.24)
GLUCOSE: 94 mg/dL (ref 70–99)
Potassium: 4.3 mmol/L (ref 3.5–5.1)
Sodium: 138 mmol/L (ref 135–145)

## 2017-11-18 LAB — CBC
HCT: 37.6 % — ABNORMAL LOW (ref 39.0–52.0)
HEMOGLOBIN: 13.8 g/dL (ref 13.0–17.0)
MCH: 31.9 pg (ref 26.0–34.0)
MCHC: 36.7 g/dL — ABNORMAL HIGH (ref 30.0–36.0)
MCV: 87 fL (ref 78.0–100.0)
PLATELETS: 481 10*3/uL — AB (ref 150–400)
RBC: 4.32 MIL/uL (ref 4.22–5.81)
RDW: 12.9 % (ref 11.5–15.5)
WBC: 5.2 10*3/uL (ref 4.0–10.5)

## 2017-11-18 NOTE — Progress Notes (Signed)
PCP - Alysia Penna Cardiologist - Fransico Him - last saw in jan 2019  Chest x-ray - not needed EKG - 03/20/17 Stress Test -2015  ECHO - 2015 Cardiac Cath - denies   Anesthesia review: yes, allison note 03/20/17  Patient denies shortness of breath, fever, cough and chest pain at PAT appointment   Patient verbalized understanding of instructions that were given to them at the PAT appointment. Patient was also instructed that they will need to review over the PAT instructions again at home before surgery.

## 2017-11-19 NOTE — Progress Notes (Signed)
Anesthesia Chart Review:  Case:  [id number] Date/Time:  11/26/17 0715   Procedure:  RIGHT TOTAL SHOULDER ARTHROPLASTY (Right Shoulder)   Anesthesia type:  General   Pre-op diagnosis:  right shoulder osteoarthritis   Location:  [facility] OR ROOM 06 / [facility]   Surgeon:  Justice Britain, MD      DISCUSSION: Patient is a 57 year old male scheduled for the above procedure. He is s/p left total shoulder on 03/26/17. He was seen by cardiologist Fransico Him, MD for preoperative evaluation prior to that surgery. He has a chronic left BBB with previous coronary CTA in 2015 showing no definite obstructive CAD with calcium score of 0. EF low normal 50-55% by echo.   History includes never smoker, arthritis, left bundle branch block.  If no acute changes then I anticipate that he can proceed as planned.   VS: BP 123/77   Pulse (!) 57   Temp 36.8 C   Resp 20   Ht 5' 11.5" (1.816 m)   Wt 87 kg   SpO2 95%   BMI 26.39 kg/m   PROVIDERS: Laurey Morale, MD is PCP Fransico Him, MD is cardiologist. First seen 08/03/13 for evaluation of left BBB. Stress test intermediate risk (RCA territory scar, EF 41%), EF 50-55%. Coronary CT was ordered and showed calcium score of 0 with no definite obstructive CAD but images were limited. Last visit 03/25/17 for preoperative evaluation for left total shoulder. No new testing ordered at that time.    LABS: Labs reviewed: Acceptable for surgery. Cr 1.31 (previously 1.03-1.27 since 08/14/16). (all labs ordered are listed, but only abnormal results are displayed)  Labs Reviewed  BASIC METABOLIC PANEL - Abnormal; Notable for the following components:      Result Value   Creatinine, Ser 1.31 (*)    Calcium 8.8 (*)    All other components within normal limits  CBC - Abnormal; Notable for the following components:   HCT 37.6 (*)    MCHC 36.7 (*)    Platelets 481 (*)    All other components within normal limits  SURGICAL PCR SCREEN    EKG: 03/20/17: NSR, left BBB. (Left BBB has  been present since at least 08/02/13.)   CV: CTA coronary 09/12/13:  - Coronary Arteries: Left dominant with no anomalies - LM: No plaque or stenosis. - LAD system: No plaque or stenosis noted. However, the distal LAD was poorly visualized. - Circumflex system: There was a moderate ramus. No plaque or stenosis noted but interpretation of the distal vessel was limited by misregistration artifact. There was a moderate OM1. No plaque or stenosis noted but interpretation of the distal vessel was limited by misregistration artifact. The AV LCx was a dominant vessel providing the left PDA. The distal LCx system was not well-visualized, there was misregistration artifact. In the mid LCx, cannot rule out mild stenosis with soft plaque. - RCA: Small nondominant vessel. No plaque or stenosis noted. There was misregistration artifact present but did not appreciably inhibit interpretation of this vessel. IMPRESSION: 1. Coronary artery calcium score of 0 Agatston units, suggesting low risk of future cardiac events. 2. Left dominant system with no definite obstructive plaque or stenosis noted in the coronary tree. Interpretation was limited as above by significant misregistration artifact. Unfortunately, a faulty protocol was used and we were only able to generate images of 1 phase (rather than 3 phases).  Nuclear stress test 08/18/13:  Overall Impression: Intermediate risk stress nuclear study with a large scar  in the RCA territory, no ischemia. . LV Ejection Fraction: 41%. LV Wall Motion: Paradoxical septal motion and hypokinesis of the entire inferior, basal and mid inferoseptal walls.   Echo 08/18/13: Study Conclusions - Left ventricle: The cavity size was normal. Wall thickness was normal. Systolic function was normal. The estimated ejection fraction was in the range of 50% to 55%. Wall motion was normal; there were no regional wall motion abnormalities. - Left atrium: The atrium was mildly  dilated.   Past Medical History:  Diagnosis Date  . Arthritis    "joints" (03/26/2017)  . Basal cell carcinoma 2000s   "burned off my chest"  . Impingement syndrome of left shoulder 11/2015  . Left bundle branch block (LBBB) 2015   Coronary CTA with calcium score of 0 and normal coronary anatomy  . Rotator cuff strain 11/2015   left    Past Surgical History:  Procedure Laterality Date  . COLONOSCOPY WITH PROPOFOL  09/20/2015   per Dr. Hilarie Fredrickson, adenomatous polyps, repeat in 5 yrs   . JOINT REPLACEMENT    . LASIK Bilateral   . SHOULDER ARTHROSCOPY WITH ROTATOR CUFF REPAIR Left 11/2015  . SHOULDER OPEN ROTATOR CUFF REPAIR Left 1992   Dr. Percell Miller  . TONSILLECTOMY    . TOTAL SHOULDER ARTHROPLASTY Left 03/26/2017   Procedure: TOTAL SHOULDER ARTHROPLASTY;  Surgeon: Justice Britain, MD;  Location: [facility];  Service: Orthopedics;  Laterality: Left;  . TOTAL SHOULDER REPLACEMENT Left 03/26/2017  . VARICOCELECTOMY Bilateral 09/14/2001  . WISDOM TOOTH EXTRACTION      MEDICATIONS: . LORazepam (ATIVAN) 1 MG tablet  . Multiple Vitamins-Minerals (MULTIVITAMIN GUMMIES ADULT PO)  . naproxen sodium (ALEVE) 220 MG tablet  . rizatriptan (MAXALT) 10 MG tablet  . Triamcinolone Acetonide (TRIAMCINOLONE 0.1 % CREAM : EUCERIN) CREA   No current facility-administered medications for this encounter.     [facility] Short Stay Center/Anesthesiology Phone [phone] 11/19/2017 12:41 PM

## 2017-11-19 NOTE — Telephone Encounter (Signed)
Last fill 05/21/17 Last OV 08/17/17  Ok to fill?

## 2017-11-23 NOTE — Telephone Encounter (Signed)
Call in #60 with 5 rf 

## 2017-11-25 MED ORDER — SODIUM CHLORIDE 0.9 % IV SOLN
1000.0000 mg | INTRAVENOUS | Status: AC
  Administered 2017-11-26: 1000 mg via INTRAVENOUS
  Filled 2017-11-25: qty 1100

## 2017-11-25 MED ORDER — CEFAZOLIN SODIUM-DEXTROSE 2-4 GM/100ML-% IV SOLN
2.0000 g | INTRAVENOUS | Status: AC
  Administered 2017-11-26: 2 g via INTRAVENOUS
  Filled 2017-11-25 (×2): qty 100

## 2017-11-25 NOTE — Telephone Encounter (Signed)
Rx has been called in  

## 2017-11-26 ENCOUNTER — Ambulatory Visit (HOSPITAL_COMMUNITY)
Admission: RE | Admit: 2017-11-26 | Discharge: 2017-11-27 | Disposition: A | Discharge status: Home / Self Care | Payer: 59 | Source: Ambulatory Visit | Attending: Orthopedic Surgery | Admitting: Orthopedic Surgery

## 2017-11-26 ENCOUNTER — Other Ambulatory Visit: Payer: Self-pay

## 2017-11-26 ENCOUNTER — Ambulatory Visit (HOSPITAL_COMMUNITY): Payer: 59 | Admitting: Certified Registered Nurse Anesthetist

## 2017-11-26 ENCOUNTER — Ambulatory Visit (HOSPITAL_COMMUNITY): Payer: 59 | Admitting: Vascular Surgery

## 2017-11-26 ENCOUNTER — Encounter (HOSPITAL_COMMUNITY): Payer: Self-pay

## 2017-11-26 ENCOUNTER — Encounter (HOSPITAL_COMMUNITY)
Admission: RE | Disposition: A | Discharge status: Home / Self Care | Payer: Self-pay | Source: Ambulatory Visit | Attending: Orthopedic Surgery

## 2017-11-26 DIAGNOSIS — I447 Left bundle-branch block, unspecified: Secondary | ICD-10-CM | POA: Diagnosis not present

## 2017-11-26 DIAGNOSIS — Z23 Encounter for immunization: Secondary | ICD-10-CM | POA: Insufficient documentation

## 2017-11-26 DIAGNOSIS — Z96619 Presence of unspecified artificial shoulder joint: Secondary | ICD-10-CM

## 2017-11-26 DIAGNOSIS — Z85828 Personal history of other malignant neoplasm of skin: Secondary | ICD-10-CM | POA: Insufficient documentation

## 2017-11-26 DIAGNOSIS — Z79899 Other long term (current) drug therapy: Secondary | ICD-10-CM | POA: Diagnosis not present

## 2017-11-26 DIAGNOSIS — F329 Major depressive disorder, single episode, unspecified: Secondary | ICD-10-CM | POA: Insufficient documentation

## 2017-11-26 DIAGNOSIS — G8918 Other acute postprocedural pain: Secondary | ICD-10-CM | POA: Diagnosis not present

## 2017-11-26 DIAGNOSIS — M19011 Primary osteoarthritis, right shoulder: Secondary | ICD-10-CM | POA: Diagnosis not present

## 2017-11-26 HISTORY — PX: TOTAL SHOULDER ARTHROPLASTY: SHX126

## 2017-11-26 SURGERY — ARTHROPLASTY, SHOULDER, TOTAL
Anesthesia: Regional | Site: Shoulder | Laterality: Right

## 2017-11-26 MED ORDER — SODIUM CHLORIDE 0.9 % IV SOLN
INTRAVENOUS | Status: DC | PRN
  Administered 2017-11-26: 25 ug/min via INTRAVENOUS

## 2017-11-26 MED ORDER — BUPIVACAINE HCL (PF) 0.5 % IJ SOLN
INTRAMUSCULAR | Status: DC | PRN
  Administered 2017-11-26: 15 mL via PERINEURAL

## 2017-11-26 MED ORDER — DEXAMETHASONE SODIUM PHOSPHATE 10 MG/ML IJ SOLN
INTRAMUSCULAR | Status: DC | PRN
  Administered 2017-11-26: 10 mg via INTRAVENOUS

## 2017-11-26 MED ORDER — GLYCOPYRROLATE PF 0.2 MG/ML IJ SOSY
PREFILLED_SYRINGE | INTRAMUSCULAR | Status: DC | PRN
  Administered 2017-11-26: .2 mg via INTRAVENOUS

## 2017-11-26 MED ORDER — MIDAZOLAM HCL 2 MG/2ML IJ SOLN
INTRAMUSCULAR | Status: DC | PRN
  Administered 2017-11-26: 2 mg via INTRAVENOUS

## 2017-11-26 MED ORDER — PHENOL 1.4 % MT LIQD: 1.0000 | OROMUCOSAL | Status: DC | PRN

## 2017-11-26 MED ORDER — DOCUSATE SODIUM 100 MG PO CAPS
100.0000 mg | ORAL_CAPSULE | Freq: Two times a day (BID) | ORAL | Status: DC
  Administered 2017-11-26 – 2017-11-27 (×3): 100 mg via ORAL
  Filled 2017-11-26 (×3): qty 1

## 2017-11-26 MED ORDER — METHOCARBAMOL 1000 MG/10ML IJ SOLN
500.0000 mg | Freq: Four times a day (QID) | INTRAVENOUS | Status: DC | PRN
  Filled 2017-11-26: qty 5

## 2017-11-26 MED ORDER — ROCURONIUM BROMIDE 50 MG/5ML IV SOSY
PREFILLED_SYRINGE | INTRAVENOUS | Status: DC | PRN
  Administered 2017-11-26: 50 mg via INTRAVENOUS

## 2017-11-26 MED ORDER — 0.9 % SODIUM CHLORIDE (POUR BTL) OPTIME
TOPICAL | Status: DC | PRN
  Administered 2017-11-26: 1000 mL

## 2017-11-26 MED ORDER — PROPOFOL 10 MG/ML IV BOLUS
INTRAVENOUS | Status: AC
  Filled 2017-11-26: qty 20

## 2017-11-26 MED ORDER — HYDROMORPHONE HCL 1 MG/ML IJ SOLN: 0.5000 mg | INTRAMUSCULAR | Status: DC | PRN

## 2017-11-26 MED ORDER — BISACODYL 5 MG PO TBEC: 5.0000 mg | DELAYED_RELEASE_TABLET | Freq: Every day | ORAL | Status: DC | PRN

## 2017-11-26 MED ORDER — KETOROLAC TROMETHAMINE 15 MG/ML IJ SOLN
15.0000 mg | Freq: Four times a day (QID) | INTRAMUSCULAR | Status: DC
  Administered 2017-11-26 – 2017-11-27 (×3): 15 mg via INTRAVENOUS
  Filled 2017-11-26 (×4): qty 1

## 2017-11-26 MED ORDER — ONDANSETRON HCL 4 MG/2ML IJ SOLN
INTRAMUSCULAR | Status: AC
  Filled 2017-11-26: qty 2

## 2017-11-26 MED ORDER — LACTATED RINGERS IV SOLN
INTRAVENOUS | Status: DC
  Administered 2017-11-26: 12:00:00 via INTRAVENOUS

## 2017-11-26 MED ORDER — EPHEDRINE 5 MG/ML INJ
INTRAVENOUS | Status: AC
  Filled 2017-11-26: qty 10

## 2017-11-26 MED ORDER — LORAZEPAM 1 MG PO TABS: 1.0000 mg | ORAL_TABLET | Freq: Four times a day (QID) | ORAL | Status: DC | PRN

## 2017-11-26 MED ORDER — LIDOCAINE 2% (20 MG/ML) 5 ML SYRINGE
INTRAMUSCULAR | Status: AC
  Filled 2017-11-26: qty 5

## 2017-11-26 MED ORDER — ALUM & MAG HYDROXIDE-SIMETH 200-200-20 MG/5ML PO SUSP: 30.0000 mL | ORAL | Status: DC | PRN

## 2017-11-26 MED ORDER — METHOCARBAMOL 500 MG PO TABS
500.0000 mg | ORAL_TABLET | Freq: Four times a day (QID) | ORAL | Status: DC | PRN
  Administered 2017-11-27: 500 mg via ORAL
  Filled 2017-11-26: qty 1

## 2017-11-26 MED ORDER — METOCLOPRAMIDE HCL 5 MG/ML IJ SOLN: 5.0000 mg | Freq: Three times a day (TID) | INTRAMUSCULAR | Status: DC | PRN

## 2017-11-26 MED ORDER — FENTANYL CITRATE (PF) 250 MCG/5ML IJ SOLN
INTRAMUSCULAR | Status: AC
  Filled 2017-11-26: qty 5

## 2017-11-26 MED ORDER — PROPOFOL 10 MG/ML IV BOLUS
INTRAVENOUS | Status: DC | PRN
  Administered 2017-11-26: 160 mg via INTRAVENOUS

## 2017-11-26 MED ORDER — FLEET ENEMA 7-19 GM/118ML RE ENEM: 1.0000 | ENEMA | Freq: Once | RECTAL | Status: DC | PRN

## 2017-11-26 MED ORDER — EPHEDRINE SULFATE-NACL 50-0.9 MG/10ML-% IV SOSY
PREFILLED_SYRINGE | INTRAVENOUS | Status: DC | PRN
  Administered 2017-11-26 (×2): 10 mg via INTRAVENOUS

## 2017-11-26 MED ORDER — METOCLOPRAMIDE HCL 5 MG PO TABS: 5.0000 mg | ORAL_TABLET | Freq: Three times a day (TID) | ORAL | Status: DC | PRN

## 2017-11-26 MED ORDER — BUPIVACAINE LIPOSOME 1.3 % IJ SUSP
INTRAMUSCULAR | Status: DC | PRN
  Administered 2017-11-26: 10 mL via PERINEURAL

## 2017-11-26 MED ORDER — DIPHENHYDRAMINE HCL 12.5 MG/5ML PO ELIX: 12.5000 mg | ORAL_SOLUTION | ORAL | Status: DC | PRN

## 2017-11-26 MED ORDER — CHLORHEXIDINE GLUCONATE 4 % EX LIQD: 60.0000 mL | Freq: Once | CUTANEOUS | Status: DC

## 2017-11-26 MED ORDER — ONDANSETRON HCL 4 MG/2ML IJ SOLN: 4.0000 mg | Freq: Four times a day (QID) | INTRAMUSCULAR | Status: DC | PRN

## 2017-11-26 MED ORDER — TEMAZEPAM 15 MG PO CAPS: 15.0000 mg | ORAL_CAPSULE | Freq: Every evening | ORAL | Status: DC | PRN

## 2017-11-26 MED ORDER — SUGAMMADEX SODIUM 200 MG/2ML IV SOLN
INTRAVENOUS | Status: DC | PRN
  Administered 2017-11-26: 200 mg via INTRAVENOUS

## 2017-11-26 MED ORDER — GLYCOPYRROLATE PF 0.2 MG/ML IJ SOSY
PREFILLED_SYRINGE | INTRAMUSCULAR | Status: AC
  Filled 2017-11-26: qty 1

## 2017-11-26 MED ORDER — OXYCODONE HCL 5 MG PO TABS
5.0000 mg | ORAL_TABLET | ORAL | Status: DC | PRN
  Administered 2017-11-27: 5 mg via ORAL
  Filled 2017-11-26: qty 1

## 2017-11-26 MED ORDER — FENTANYL CITRATE (PF) 100 MCG/2ML IJ SOLN
INTRAMUSCULAR | Status: DC | PRN
  Administered 2017-11-26 (×3): 50 ug via INTRAVENOUS

## 2017-11-26 MED ORDER — ROCURONIUM BROMIDE 50 MG/5ML IV SOSY
PREFILLED_SYRINGE | INTRAVENOUS | Status: AC
  Filled 2017-11-26: qty 5

## 2017-11-26 MED ORDER — POLYETHYLENE GLYCOL 3350 17 G PO PACK: 17.0000 g | PACK | Freq: Every day | ORAL | Status: DC | PRN

## 2017-11-26 MED ORDER — ONDANSETRON HCL 4 MG/2ML IJ SOLN
INTRAMUSCULAR | Status: DC | PRN
  Administered 2017-11-26: 4 mg via INTRAVENOUS

## 2017-11-26 MED ORDER — MIDAZOLAM HCL 2 MG/2ML IJ SOLN
INTRAMUSCULAR | Status: AC
  Filled 2017-11-26: qty 2

## 2017-11-26 MED ORDER — INFLUENZA VAC SPLIT QUAD 0.5 ML IM SUSY
0.5000 mL | PREFILLED_SYRINGE | INTRAMUSCULAR | Status: AC
  Administered 2017-11-27: 0.5 mL via INTRAMUSCULAR
  Filled 2017-11-26: qty 0.5

## 2017-11-26 MED ORDER — ACETAMINOPHEN 325 MG PO TABS
325.0000 mg | ORAL_TABLET | Freq: Four times a day (QID) | ORAL | Status: DC | PRN
  Administered 2017-11-26: 650 mg via ORAL
  Filled 2017-11-26: qty 2

## 2017-11-26 MED ORDER — OXYCODONE HCL 5 MG PO TABS: 10.0000 mg | ORAL_TABLET | ORAL | Status: DC | PRN

## 2017-11-26 MED ORDER — MENTHOL 3 MG MT LOZG: 1.0000 | LOZENGE | OROMUCOSAL | Status: DC | PRN

## 2017-11-26 MED ORDER — ONDANSETRON HCL 4 MG PO TABS: 4.0000 mg | ORAL_TABLET | Freq: Four times a day (QID) | ORAL | Status: DC | PRN

## 2017-11-26 MED ORDER — LACTATED RINGERS IV SOLN
INTRAVENOUS | Status: DC | PRN
  Administered 2017-11-26 (×2): via INTRAVENOUS

## 2017-11-26 MED ORDER — DEXAMETHASONE SODIUM PHOSPHATE 10 MG/ML IJ SOLN
INTRAMUSCULAR | Status: AC
  Filled 2017-11-26: qty 1

## 2017-11-26 SURGICAL SUPPLY — 62 items
ADH SKN CLS LQ APL DERMABOND (GAUZE/BANDAGES/DRESSINGS) ×1
AID PSTN UNV HD RSTRNT DISP (MISCELLANEOUS) ×1
BIT DRILL 5/64X5 DISP (BIT) ×2 IMPLANT
BLADE SAW SGTL 83.5X18.5 (BLADE) ×2 IMPLANT
CEMENT BONE DEPUY (Cement) ×2 IMPLANT
COVER SURGICAL LIGHT HANDLE (MISCELLANEOUS) ×2 IMPLANT
DERMABOND ADHESIVE PROPEN (GAUZE/BANDAGES/DRESSINGS) ×1
DERMABOND ADVANCED .7 DNX6 (GAUZE/BANDAGES/DRESSINGS) ×1 IMPLANT
DRAPE ORTHO SPLIT 77X108 STRL (DRAPES) ×4
DRAPE SURG 17X11 SM STRL (DRAPES) ×2 IMPLANT
DRAPE SURG ORHT 6 SPLT 77X108 (DRAPES) ×2 IMPLANT
DRAPE U-SHAPE 47X51 STRL (DRAPES) ×2 IMPLANT
DRSG AQUACEL AG ADV 3.5X10 (GAUZE/BANDAGES/DRESSINGS) ×2 IMPLANT
DURAPREP 26ML APPLICATOR (WOUND CARE) ×2 IMPLANT
ELECT BLADE 4.0 EZ CLEAN MEGAD (MISCELLANEOUS) ×2
ELECT CAUTERY BLADE 6.4 (BLADE) ×2 IMPLANT
ELECT REM PT RETURN 9FT ADLT (ELECTROSURGICAL) ×2
ELECTRODE BLDE 4.0 EZ CLN MEGD (MISCELLANEOUS) ×1 IMPLANT
ELECTRODE REM PT RTRN 9FT ADLT (ELECTROSURGICAL) ×1 IMPLANT
FACESHIELD WRAPAROUND (MASK) ×6 IMPLANT
FACESHIELD WRAPAROUND OR TEAM (MASK) ×3 IMPLANT
GLENOID UNI VAULTLOCK LRG (Shoulder) ×1 IMPLANT
GLOVE BIO SURGEON STRL SZ7.5 (GLOVE) ×2 IMPLANT
GLOVE BIO SURGEON STRL SZ8 (GLOVE) ×2 IMPLANT
GLOVE EUDERMIC 7 POWDERFREE (GLOVE) ×2 IMPLANT
GLOVE SS BIOGEL STRL SZ 7.5 (GLOVE) ×1 IMPLANT
GLOVE SUPERSENSE BIOGEL SZ 7.5 (GLOVE) ×1
GOWN STRL REUS W/ TWL LRG LVL3 (GOWN DISPOSABLE) ×1 IMPLANT
GOWN STRL REUS W/ TWL XL LVL3 (GOWN DISPOSABLE) ×2 IMPLANT
GOWN STRL REUS W/TWL LRG LVL3 (GOWN DISPOSABLE) ×2
GOWN STRL REUS W/TWL XL LVL3 (GOWN DISPOSABLE) ×4
HEAD HUMERAL UNIVERS II 50/19 (Head) ×1 IMPLANT
KIT BASIN OR (CUSTOM PROCEDURE TRAY) ×2 IMPLANT
KIT TURNOVER KIT B (KITS) ×2 IMPLANT
MANIFOLD NEPTUNE II (INSTRUMENTS) ×2 IMPLANT
NDL TAPERED W/ NITINOL LOOP (MISCELLANEOUS) ×1 IMPLANT
NEEDLE TAPERED W/ NITINOL LOOP (MISCELLANEOUS) ×2 IMPLANT
NS IRRIG 1000ML POUR BTL (IV SOLUTION) ×2 IMPLANT
PACK SHOULDER (CUSTOM PROCEDURE TRAY) ×2 IMPLANT
PAD ARMBOARD 7.5X6 YLW CONV (MISCELLANEOUS) ×4 IMPLANT
RESTRAINT HEAD UNIVERSAL NS (MISCELLANEOUS) ×2 IMPLANT
SET PIN UNIVERSAL REVERSE (SET/KITS/TRAYS/PACK) ×1 IMPLANT
SLING ARM FOAM STRAP LRG (SOFTGOODS) IMPLANT
SLING ARM XL FOAM STRAP (SOFTGOODS) ×2 IMPLANT
SMARTMIX MINI TOWER (MISCELLANEOUS) ×2
SPONGE LAP 18X18 X RAY DECT (DISPOSABLE) ×2 IMPLANT
SPONGE LAP 4X18 RFD (DISPOSABLE) ×2 IMPLANT
STEM HUMERAL APEX UNI 12MM (Stem) ×1 IMPLANT
SUCTION FRAZIER HANDLE 10FR (MISCELLANEOUS) ×1
SUCTION TUBE FRAZIER 10FR DISP (MISCELLANEOUS) ×1 IMPLANT
SUT FIBERWIRE #2 38 REV NDL BL (SUTURE) ×4
SUT MNCRL AB 3-0 PS2 18 (SUTURE) ×2 IMPLANT
SUT MON AB 2-0 CT1 36 (SUTURE) ×2 IMPLANT
SUT VIC AB 1 CT1 27 (SUTURE) ×10
SUT VIC AB 1 CT1 27XBRD ANBCTR (SUTURE) ×3 IMPLANT
SUTURE FIBERWR#2 38 REV NDL BL (SUTURE) ×1 IMPLANT
SUTURE TAPE 1.3 40 TPR END (SUTURE) ×3 IMPLANT
SUTURETAPE 1.3 40 TPR END (SUTURE) ×8
SYR CONTROL 10ML LL (SYRINGE) IMPLANT
TOWEL OR 17X26 10 PK STRL BLUE (TOWEL DISPOSABLE) ×2 IMPLANT
TOWER SMARTMIX MINI (MISCELLANEOUS) ×1 IMPLANT
WATER STERILE IRR 1000ML POUR (IV SOLUTION) ×2 IMPLANT

## 2017-11-26 NOTE — Transfer of Care (Signed)
Immediate Anesthesia Transfer of Care Note  Patient: Ricky Freeman  Procedure(s) Performed: RIGHT TOTAL SHOULDER ARTHROPLASTY (Right Shoulder)  Patient Location: PACU  Anesthesia Type:GA combined with regional for post-op pain  Level of Consciousness: awake, alert  and oriented  Airway & Oxygen Therapy: Patient Spontanous Breathing and Patient connected to face mask oxygen  Post-op Assessment: Report given to RN and Post -op Vital signs reviewed and stable  Post vital signs: Reviewed and stable  Last Vitals:  Vitals Value Taken Time  BP 133/77 11/26/2017 10:29 AM  Temp    Pulse 76 11/26/2017 10:28 AM  Resp 15 11/26/2017 10:28 AM  SpO2 100 % 11/26/2017 10:28 AM  Vitals shown include unvalidated device data.  Last Pain:  Vitals:   11/26/17 0600  TempSrc:   PainSc: 0-No pain         Complications: No apparent anesthesia complications

## 2017-11-26 NOTE — Anesthesia Preprocedure Evaluation (Addendum)
Anesthesia Evaluation  Patient identified by MRN, date of birth, ID band Patient awake    Reviewed: Allergy & Precautions, NPO status , Patient's Chart, lab work & pertinent test results  Airway Mallampati: II  TM Distance: >3 FB Neck ROM: Full    Dental no notable dental hx.    Pulmonary neg pulmonary ROS,    Pulmonary exam normal breath sounds clear to auscultation       Cardiovascular negative cardio ROS Normal cardiovascular exam Rhythm:Regular Rate:Normal  ECG: Rate 60. Normal sinus rhythm Left bundle branch block   Neuro/Psych PSYCHIATRIC DISORDERS Depression negative neurological ROS     GI/Hepatic negative GI ROS, Neg liver ROS,   Endo/Other  negative endocrine ROS  Renal/GU negative Renal ROS     Musculoskeletal  (+) Arthritis , Osteoarthritis,    Abdominal   Peds  Hematology negative hematology ROS (+)   Anesthesia Other Findings right shoulder osteoarthritis  Reproductive/Obstetrics                            Anesthesia Physical Anesthesia Plan  ASA: II  Anesthesia Plan: General and Regional   Post-op Pain Management: GA combined w/ Regional for post-op pain   Induction: Intravenous  PONV Risk Score and Plan: 2 and Ondansetron, Dexamethasone, Midazolam and Treatment may vary due to age or medical condition  Airway Management Planned: Oral ETT  Additional Equipment:   Intra-op Plan:   Post-operative Plan: Extubation in OR  Informed Consent: I have reviewed the patients History and Physical, chart, labs and discussed the procedure including the risks, benefits and alternatives for the proposed anesthesia with the patient or authorized representative who has indicated his/her understanding and acceptance.   Dental advisory given  Plan Discussed with: CRNA  Anesthesia Plan Comments:         Anesthesia Quick Evaluation

## 2017-11-26 NOTE — Discharge Instructions (Signed)
° °Kevin M. Supple, M.D., F.A.A.O.S. °Orthopaedic Surgery °Specializing in Arthroscopic and Reconstructive °Surgery of the Shoulder and Knee °[phone] °[address] - [place], [place] [address] - Fax [phone] ° ° °POST-OP TOTAL SHOULDER REPLACEMENT INSTRUCTIONS ° °1. Call the office at [phone] to schedule your first post-op appointment 10-14 days from the date of your surgery. ° °2. The bandage over your incision is waterproof. You may begin showering with this dressing on. You may leave this dressing on until first follow up appointment within 2 weeks. We prefer you leave this dressing in place until follow up however after 5-7 days if you are having itching or skin irritation and would like to remove it you may do so. Go slow and tug at the borders gently to break the bond the dressing has with the skin. At this point if there is no drainage it is okay to go without a bandage or you may cover it with a light guaze and tape. You can also expect significant bruising around your shoulder that will drift down your arm and into your chest wall. This is very normal and should resolve over several days. ° ° 3. Wear your sling/immobilizer at all times except to perform the exercises below or to occasionally let your arm dangle by your side to stretch your elbow. You also need to sleep in your sling immobilizer until instructed otherwise. ° °4. Range of motion to your elbow, wrist, and hand are encouraged 3-5 times daily. Exercise to your hand and fingers helps to reduce swelling you may experience. ° °5. Utilize ice to the shoulder 3-5 times minimum a day and additionally if you are experiencing pain. ° °6. Prescriptions for a pain medication and a muscle relaxant are provided for you. It is recommended that if you are experiencing pain that you pain medication alone is not controlling, add the muscle relaxant along with the pain medication which can give additional pain relief. The first 1-2 days  is generally the most severe of your pain and then should gradually decrease. As your pain lessens it is recommended that you decrease your use of the pain medications to an "as needed basis'" only and to always comply with the recommended dosages of the pain medications. ° °7. Pain medications can produce constipation along with their use. If you experience this, the use of an over the counter stool softener or laxative daily is recommended.  ° °8. For additional questions or concerns, please do not hesitate to call the office. If after hours there is an answering service to forward your concerns to the physician on call. ° °9.Pain control following an exparel block ° °To help control your post-operative pain you received a nerve block  performed with Exparel which is a long acting anesthetic (numbing agent) which can provide pain relief and sensations of numbness (and relief of pain) in the operative shoulder and arm for up to 3 days. Sometimes it provides mixed relief, meaning you may still have numbness in certain areas of the arm but can still be able to move  parts of that arm, hand, and fingers. We recommend that your prescribed pain medications  be used as needed. We do not feel it is necessary to "pre medicate" and "stay ahead" of pain.  Taking narcotic pain medications when you are not having any pain can lead to unnecessary and potentially dangerous side effects.  ° °POST-OP EXERCISES ° °Pendulum Exercises ° °Perform pendulum exercises while standing and bending at   the waist. Support your uninvolved arm on a table or chair and allow your operated arm to hang freely. Make sure to do these exercises passively - not using you shoulder muscles. ° °Repeat 20 times. Do 3 sessions per day. ° ° ° ° °

## 2017-11-26 NOTE — Anesthesia Procedure Notes (Signed)
Anesthesia Regional Block: Interscalene brachial plexus block   Pre-Anesthetic Checklist: ,, timeout performed, Correct Patient, Correct Site, Correct Laterality, Correct Procedure, Correct Position, site marked, Risks and benefits discussed,  Surgical consent,  Pre-op evaluation,  At surgeon's request and post-op pain management  Laterality: Right  Prep: chloraprep       Needles:  Injection technique: Single-shot  Needle Type: Echogenic Stimulator Needle     Needle Length: 9cm  Needle Gauge: 21     Additional Needles:   Procedures:,,,, ultrasound used (permanent image in chart),,,,  Narrative:  Start time: 11/26/2017 7:20 AM End time: 11/26/2017 7:30 AM Injection made incrementally with aspirations every 5 mL.  Performed by: Personally  Anesthesiologist: Murvin Natal, MD  Additional Notes: Functioning IV was confirmed and monitors were applied.  A 50mm 21ga Arrow echogenic stimulator needle was used. Sterile prep, hand hygiene and sterile gloves were used.  Negative aspiration and negative test dose prior to incremental administration of local anesthetic. The patient tolerated the procedure well.  Ultrasound guidance: relevent anatomy identified, needle position confirmed, local anesthetic spread visualized around nerve(s), vascular puncture avoided.  Image printed for medical record.

## 2017-11-26 NOTE — Anesthesia Procedure Notes (Signed)
Procedure Name: Intubation Date/Time: 11/26/2017 7:45 AM Performed by: Genelle Bal, CRNA Pre-anesthesia Checklist: Patient identified, Emergency Drugs available, Suction available and Patient being monitored Patient Re-evaluated:Patient Re-evaluated prior to induction Oxygen Delivery Method: Circle system utilized Preoxygenation: Pre-oxygenation with 100% oxygen Induction Type: IV induction Ventilation: Mask ventilation without difficulty Laryngoscope Size: Miller and 2 Grade View: Grade I Tube type: Oral Tube size: 7.5 mm Number of attempts: 1 Airway Equipment and Method: Stylet Placement Confirmation: ETT inserted through vocal cords under direct vision,  positive ETCO2 and breath sounds checked- equal and bilateral Secured at: 23 cm Tube secured with: Tape Dental Injury: Teeth and Oropharynx as per pre-operative assessment

## 2017-11-26 NOTE — H&P (Signed)
Ricky Freeman    Chief Complaint: right shoulder osteoarthritis HPI: The patient is a 57 y.o. male with end stage right shoulder OA for planned R TSA.  Past Medical History:  Diagnosis Date  . Arthritis    "joints" (03/26/2017)  . Basal cell carcinoma 2000s   "burned off my chest"  . Impingement syndrome of left shoulder 11/2015  . Left bundle branch block (LBBB) 2015   Coronary CTA with calcium score of 0 and normal coronary anatomy  . Rotator cuff strain 11/2015   left    Past Surgical History:  Procedure Laterality Date  . COLONOSCOPY WITH PROPOFOL  09/20/2015   per Dr. Hilarie Fredrickson, adenomatous polyps, repeat in 5 yrs   . JOINT REPLACEMENT    . LASIK Bilateral   . SHOULDER ARTHROSCOPY WITH ROTATOR CUFF REPAIR Left 11/2015  . SHOULDER OPEN ROTATOR CUFF REPAIR Left 1992   Dr. Percell Miller  . TONSILLECTOMY    . TOTAL SHOULDER ARTHROPLASTY Left 03/26/2017   Procedure: TOTAL SHOULDER ARTHROPLASTY;  Surgeon: Justice Britain, MD;  Location: [facility];  Service: Orthopedics;  Laterality: Left;  . TOTAL SHOULDER REPLACEMENT Left 03/26/2017  . VARICOCELECTOMY Bilateral 09/14/2001  . WISDOM TOOTH EXTRACTION      Family History  Problem Relation Age of Onset  . Prostate cancer Father   . Colon cancer Mother 33       stage I colon ca with part colectomy    Social History:  reports that he has never smoked. He has never used smokeless tobacco. He reports that he does not drink alcohol or use drugs.   Medications Prior to Admission  Medication Sig Dispense Refill  . Multiple Vitamins-Minerals (MULTIVITAMIN GUMMIES ADULT PO) Take 1 tablet by mouth daily.    . naproxen sodium (ALEVE) 220 MG tablet Take 440 mg by mouth daily as needed (pain).    . rizatriptan (MAXALT) 10 MG tablet Take 1 tablet (10 mg total) by mouth as needed for migraine. May repeat in 2 hours if needed 10 tablet 11  . Triamcinolone Acetonide (TRIAMCINOLONE 0.1 % CREAM : EUCERIN) CREA Apply one application to effected area twice  daily as needed for poison ivy (Patient taking differently: Apply 1 application topically 2 (two) times daily as needed (for itching skin/poison ivy). ) 1 each 0  . LORazepam (ATIVAN) 1 MG tablet TAKE 1 TABLET EVERY 6 HOURS AS NEEDED 60 tablet 5     Physical Exam: right shoulder with painful and restricted motion as noted at recent ofice visits. Xray confirms complete loss of joint space.  Vitals  Temp:  [97.7 F (36.5 C)] 97.7 F (36.5 C) (09/12 0558) Pulse Rate:  [62] 62 (09/12 0558) Resp:  [20] 20 (09/12 0558) BP: (143)/(87) 143/87 (09/12 0558) SpO2:  [96 %] 96 % (09/12 0558)  Assessment/Plan  Impression: right shoulder osteoarthritis  Plan of Action: Procedure(s): RIGHT TOTAL SHOULDER ARTHROPLASTY  Carma Dwiggins M [name] 11/26/2017, 6:00 AM Contact # [phone]

## 2017-11-26 NOTE — Progress Notes (Signed)
Pt admitted to the unit from pacu via bed with IV intact and infusing. Pt A&O x4; RUE remains in sling with clean, dry and intact hydrosilver dsg intact with no stain or active drainage noted. Pt report decreased sensation to RUE but unable to move d/t block received during surgery. Pt oob and ambulated to the BR with RN assistance to void. VSS; pt oriented to the unit and room; fall/safety precaution and prevention education completed. Pt bed alarm on; call light within reach and will continue to closely monitor. Delia Heady RN

## 2017-11-26 NOTE — Op Note (Signed)
11/26/2017  10:20 AM  PATIENT:   Ricky Freeman  57 y.o. male  PRE-OPERATIVE DIAGNOSIS:  right shoulder osteoarthritis  POST-OPERATIVE DIAGNOSIS: Same  PROCEDURE: Right total shoulder arthroplasty utilizing a press-fit size 12 Arthrex stem, a 50 x 19 eccentric head, and a large glenoid  SURGEON:  Aime Carreras, Metta Clines M.D.  ASSISTANTS: Jenetta Loges, PA-C  ANESTHESIA:   General endotracheal with an Exparel interscalene block  EBL: 200 cc  SPECIMEN: None  Drains: None   PATIENT DISPOSITION:  PACU - hemodynamically stable.    PLAN OF CARE: Admit for overnight observation  Brief history:  Patient is a 57 year old male is had chronic and progressively increasing right shoulder pain related to end-stage osteoarthritis.  Plain radiographs confirm complete obliteration of the joint space with peripheral osteophyte formation.  He is brought to the operating this time for planned right total shoulder arthroplasty  Preoperative counseled Mr. Waibel regarding treatment options as well as the potential risks versus benefits thereof.  Possible surgical complications were reviewed including potential bleeding, infection, neurovascular injury, persistent pain, anesthetic complication, failure of the implant, and possible need for additional surgery.  He understands and accepts and agrees with her planned procedure.  Her graph procedure detail:  After undergoing routine preop evaluation patient received prophylactic antibiotics and interscalene block with Exparel was established in the holding area with anesthesia department.  Placed supine on the operative table underwent smooth induction of a general endotracheal anesthesia.  Placed in the beachchair position and appropriate padding protected.  Right shoulder girdle region was sterilely prepped and draped in standard fashion.  Timeout was called.  Anterior deltopectoral approach right shoulder was made through 10 cm incision.  Skin flaps were  elevated dissection carried deeply electrocautery was used for hemostasis.  Deltopectoral interval was then developed from proximal to distal with a vein taken laterally and there were number of varicosities of which the most superficial was ligated.  The upper centimeter half pectoralis major was then tenotomized to improve exposure.  Conjoined tendon was mobilized and retracted medially the biceps tendon was then unroofed it was tenodesed at the upper border of the pectoralis major tendon then tenotomized and the proximal portion was excised.  We then define the margins of the subscapularis both superiorly and inferiorly and performed a lesser tuberosity osteotomy with an oscillating saw removing the wafer of bone approximately 4 mm thick over the width of the tuberosity and then tagged the subscapularis and retracted this medially.  We then divided the capsule attachments from the anterior and inferior margins of the humeral neck with the very large osteophyte removed with a combination of osteotome and rondure.  Carefully protect the rotator cuff and deliver the humeral head to the wound and outlined the proposed humeral head resection with extra medullary guide and the head was resected with an oscillating saw.  We then completed removal of the osteophytes about the neck of the proximal humerus and the humerus was then prepared hand reaming to size 7 broaching up to size 12 with excellent fit fixation.  Size 11 stem with metal cap was then placed to the proximal humeral surface and this point we then exposed the glenoid with combination of [place], pitchfork, and stick, retractors.  Of note his large muscular bulk made exposure challenging and some additional releases were required to gain appropriate exposure of the glenoid and performed a circumferential labral resection gaining complete visualization the periphery of the glenoid.  Guidepin was then placed into the center  of the glenoid and this was then  reamed with a large reamer and then the central drill hole was placed followed by the superior and inferior peg and slot respectively when it was broached the trial showed excellent fit.  Limb was then copes irrigated peripheral debris around the joint was removed with rondure the glenoid was cleaned and dried cement was mixed introduced into the superior and inferior peg and slot respectively in the size large glenoid was then impacted into position with excellent fit fixation.  We then returned our attention back to the proximal humerus where we placed drill holes through the bicipital groove laterally x2 and one medial drill hole for repair of her lesser tuberosity osteotomy.  We then looped sutures through the eyelets on the stem of her humeral implant such that we could create a suture construct for repair of the lesser tuberosity osteotomy.  Once the suture limbs were appropriately passed we then impacted the humeral stem after the sutures were passed through drill holes in this humerus stem was then terminally seated with excellent fit fixation of proximal locking screws were then tightened.  Then performed a series of trial reductions and ultimately felt that the 50 x 19 eccentric head gave [place] excellent soft tissue balance with approximate 50% translation of the humeral head over the glenoid.  The final 50 x 19 eccentric head was then placed over the [place] taper was cleaned and dried and impacted with excellent stability fit and fixation.  The joint was then reduced at this point our medial suture limbs were then passed through the subscapularis at the bone tendon junction after we had thoroughly and completely mobilized the subscapularis and had achieved good elasticity.  The medial suture limbs were then tied to the adjacent and opposing lateral suture limbs such that we had superior and inferior horizontal sutures and then we also had crossing sutures x2 such that a total of 4 sutures were used to fasten  the lesser tuberosity which allowed excellent and stable re-apposition and once this was completed we then closed the rotator interval with a series of figure-of-eight suture tape sutures which allowed excellent soft tissue apposition with good soft tissue tension and at this point the shoulder could easily be externally rotated to 30 degrees without excessive tension on the repair of the tuberosity.  Wound was then copiously irrigated final hemostasis was obtained.  The deltopectoral interval was then reapproximated with a series of figure-of-eight #1 Vicryl sutures.  2-0 Vicryl used with subcu layer intracuticular 3 Monocryl for the skin followed by Dermabond and Aquasol dressing right arm was placed in the sling the patient was awakened extubated taken recovery room in stable condition  Jenetta Loges, PA-C was used as an Environmental consultant throughout this case essential for help with positioning the patient, position extremity, tissue ablation, suture management, implantation of prosthesis, wound closure, and intraoperative decision-making.  Marin Shutter MD     Contact # [phone]

## 2017-11-27 ENCOUNTER — Encounter (HOSPITAL_COMMUNITY): Payer: Self-pay | Admitting: Orthopedic Surgery

## 2017-11-27 DIAGNOSIS — M19011 Primary osteoarthritis, right shoulder: Secondary | ICD-10-CM | POA: Diagnosis not present

## 2017-11-27 DIAGNOSIS — Z96611 Presence of right artificial shoulder joint: Secondary | ICD-10-CM | POA: Diagnosis not present

## 2017-11-27 DIAGNOSIS — R600 Localized edema: Secondary | ICD-10-CM | POA: Diagnosis not present

## 2017-11-27 MED ORDER — METHOCARBAMOL 500 MG PO TABS: 500.0000 mg | ORAL_TABLET | Freq: Three times a day (TID) | ORAL | 1 refills | Status: DC | PRN

## 2017-11-27 MED ORDER — OXYCODONE-ACETAMINOPHEN 5-325 MG PO TABS: 1.0000 | ORAL_TABLET | ORAL | 0 refills | Status: DC | PRN

## 2017-11-27 MED ORDER — ONDANSETRON HCL 4 MG PO TABS: 4.0000 mg | ORAL_TABLET | Freq: Three times a day (TID) | ORAL | 0 refills | Status: DC | PRN

## 2017-11-27 NOTE — Progress Notes (Addendum)
Ricky Freeman  MRN: [id number] DOB/Age: 1960-09-27 57 y.o. [facility] Procedure: Procedure(s) (LRB): RIGHT TOTAL SHOULDER ARTHROPLASTY (Right)     Subjective: Block still working, was medicated because of slight "incisional" pain and sensation in hand, but motor and sensation at shoulder still out from block  Vital Signs Temp:  [97.7 F (36.5 C)-99.1 F (37.3 C)] 98.1 F (36.7 C) (09/13 0503) Pulse Rate:  [63-82] 63 (09/13 0503) Resp:  [11-20] 20 (09/13 0503) BP: (110-133)/(66-81) 113/66 (09/13 0503) SpO2:  [95 %-100 %] 96 % (09/13 0503)  Lab Results No results for input(s): WBC, HGB, HCT, PLT in the last 72 hours. BMET No results for input(s): NA, K, CL, CO2, GLUCOSE, BUN, CREATININE, CALCIUM in the last 72 hours. No results found for: INR   Exam Dressing dry Moving fingers, wrist extension still weak from block Sensation to shoulder diminished but feels posterior shoulder and chest to light touch        Plan DC home, cancel OT TSA protocol  Jenetta Loges PA-C  11/27/2017, 8:08 AM Contact # [phone]

## 2017-11-27 NOTE — Discharge Summary (Signed)
PATIENT ID:      Ricky Freeman  MRN:     [id number] DOB/AGE:    20-Jul-1960 / 57 y.o.     DISCHARGE SUMMARY  ADMISSION DATE:    11/26/2017 DISCHARGE DATE:    ADMISSION DIAGNOSIS: right shoulder osteoarthritis Past Medical History:  Diagnosis Date  . Arthritis    "joints" (03/26/2017)  . Basal cell carcinoma 2000s   "burned off my chest"  . Impingement syndrome of left shoulder 11/2015  . Left bundle branch block (LBBB) 2015   Coronary CTA with calcium score of 0 and normal coronary anatomy  . Rotator cuff strain 11/2015   left    DISCHARGE DIAGNOSIS:   Active Problems:   S/P shoulder replacement   PROCEDURE: Procedure(s): RIGHT TOTAL SHOULDER ARTHROPLASTY on 11/26/2017  CONSULTS:    HISTORY:  See H&P in chart.  HOSPITAL COURSE:  Ricky Freeman is a 57 y.o. admitted on 11/26/2017 with a diagnosis of right shoulder osteoarthritis.  They were brought to the operating room on 11/26/2017 and underwent Procedure(s): RIGHT TOTAL SHOULDER ARTHROPLASTY.    They were given perioperative antibiotics:  Anti-infectives (From admission, onward)   Start     Dose/Rate Route Frequency Ordered Stop   11/26/17 0630  ceFAZolin (ANCEF) IVPB 2g/100 mL premix     2 g 200 mL/hr over 30 Minutes Intravenous To ShortStay Surgical 11/25/17 0850 11/26/17 0816    .  Patient underwent the above named procedure and tolerated it well. The following day they were hemodynamically stable and pain was controlled on oral analgesics. They were neurovascularly intact to the operative extremity. OT was cancelled as patient understood protocol. They were medically and orthopaedically stable for discharge on day 1 .    DIAGNOSTIC STUDIES:  RECENT RADIOGRAPHIC STUDIES :  No results found.  RECENT VITAL SIGNS:   Patient Vitals for the past 24 hrs:  BP Temp Temp src Pulse Resp SpO2  11/27/17 0503 113/66 98.1 F (36.7 C) Oral 63 20 96 %  11/27/17 0003 112/68 99.1 F (37.3 C) Oral 68 18 96 %  11/26/17 1940  122/75 98.5 F (36.9 C) Oral 82 18 96 %  11/26/17 1812 110/69 98 F (36.7 C) Oral 73 18 95 %  11/26/17 1344 120/80 - - 70 18 98 %  11/26/17 1138 128/80 - - 73 16 100 %  11/26/17 1115 122/74 97.8 F (36.6 C) - 74 12 97 %  11/26/17 1100 126/73 - - 76 13 96 %  11/26/17 1045 117/81 - - 80 11 98 %  11/26/17 1030 133/77 97.7 F (36.5 C) - 78 20 100 %  .  RECENT EKG RESULTS:    Orders placed or performed during the hospital encounter of 03/20/17  . EKG 12-Lead  . EKG 12-Lead    DISCHARGE INSTRUCTIONS:    DISCHARGE MEDICATIONS:   Allergies as of 11/27/2017   No Known Allergies     Medication List    TAKE these medications   LORazepam 1 MG tablet Commonly known as:  ATIVAN TAKE 1 TABLET EVERY 6 HOURS AS NEEDED   methocarbamol 500 MG tablet Commonly known as:  ROBAXIN Take 1 tablet (500 mg total) by mouth every 8 (eight) hours as needed for muscle spasms.   MULTIVITAMIN GUMMIES ADULT PO Take 1 tablet by mouth daily.   naproxen sodium 220 MG tablet Commonly known as:  ALEVE Take 440 mg by mouth daily as needed (pain).   ondansetron 4 MG tablet Commonly known as:  ZOFRAN Take 1 tablet (4 mg total) by mouth every 8 (eight) hours as needed for nausea or vomiting.   oxyCODONE-acetaminophen 5-325 MG tablet Commonly known as:  PERCOCET/ROXICET Take 1 tablet by mouth every 4 (four) hours as needed (max 6 q).   rizatriptan 10 MG tablet Commonly known as:  MAXALT Take 1 tablet (10 mg total) by mouth as needed for migraine. May repeat in 2 hours if needed   triamcinolone 0.1 % cream : eucerin Crea Apply one application to effected area twice daily as needed for poison ivy What changed:    how much to take  how to take this  when to take this  reasons to take this  additional instructions       FOLLOW UP VISIT:   Follow-up Information    Justice Britain, MD.   Specialty:  Orthopedic Surgery Why:  call to be seen in 10-14days Contact information: [address] [place] [address] [phone]           DISCHARGE TO: Home   DISCHARGE CONDITION:  [place] for Dr. Justice Britain 11/27/2017, 8:14 AM

## 2017-11-27 NOTE — Progress Notes (Signed)
RN gave patient discharge instructions, pt stated understanding did not have any complaints, 3 prescriptions e-scribed to pharmacy. Pt not in any pain, knows exercises to do for shoulder. PA explained did not need any needs has PT scheduled out patient starting next week. Pt waiting for his ride.

## 2017-11-27 NOTE — Anesthesia Postprocedure Evaluation (Signed)
Anesthesia Post Note  Patient: Ricky Freeman  Procedure(s) Performed: RIGHT TOTAL SHOULDER ARTHROPLASTY (Right Shoulder)     Patient location during evaluation: PACU Anesthesia Type: Regional and General Level of consciousness: awake and alert Pain management: pain level controlled Vital Signs Assessment: post-procedure vital signs reviewed and stable Respiratory status: spontaneous breathing, nonlabored ventilation, respiratory function stable and patient connected to nasal cannula oxygen Cardiovascular status: blood pressure returned to baseline and stable Postop Assessment: no apparent nausea or vomiting Anesthetic complications: no    Last Vitals:  Vitals:   11/27/17 0003 11/27/17 0503  BP: 112/68 113/66  Pulse: 68 63  Resp: 18 20  Temp: 37.3 C 36.7 C  SpO2: 96% 96%    Last Pain:  Vitals:   11/27/17 0600  TempSrc:   PainSc: 4                  Tariah Transue P Demetrias Goodbar

## 2017-12-04 DIAGNOSIS — M19011 Primary osteoarthritis, right shoulder: Secondary | ICD-10-CM | POA: Diagnosis not present

## 2017-12-07 DIAGNOSIS — M25611 Stiffness of right shoulder, not elsewhere classified: Secondary | ICD-10-CM | POA: Diagnosis not present

## 2017-12-09 DIAGNOSIS — M25611 Stiffness of right shoulder, not elsewhere classified: Secondary | ICD-10-CM | POA: Diagnosis not present

## 2017-12-15 DIAGNOSIS — M25611 Stiffness of right shoulder, not elsewhere classified: Secondary | ICD-10-CM | POA: Diagnosis not present

## 2017-12-17 DIAGNOSIS — M25611 Stiffness of right shoulder, not elsewhere classified: Secondary | ICD-10-CM | POA: Diagnosis not present

## 2017-12-22 DIAGNOSIS — M25611 Stiffness of right shoulder, not elsewhere classified: Secondary | ICD-10-CM | POA: Diagnosis not present

## 2017-12-24 DIAGNOSIS — M25611 Stiffness of right shoulder, not elsewhere classified: Secondary | ICD-10-CM | POA: Diagnosis not present

## 2017-12-29 DIAGNOSIS — M25611 Stiffness of right shoulder, not elsewhere classified: Secondary | ICD-10-CM | POA: Diagnosis not present

## 2017-12-31 DIAGNOSIS — M25611 Stiffness of right shoulder, not elsewhere classified: Secondary | ICD-10-CM | POA: Diagnosis not present

## 2018-01-05 DIAGNOSIS — M25611 Stiffness of right shoulder, not elsewhere classified: Secondary | ICD-10-CM | POA: Diagnosis not present

## 2018-01-07 DIAGNOSIS — M25611 Stiffness of right shoulder, not elsewhere classified: Secondary | ICD-10-CM | POA: Diagnosis not present

## 2018-01-12 DIAGNOSIS — M25611 Stiffness of right shoulder, not elsewhere classified: Secondary | ICD-10-CM | POA: Diagnosis not present

## 2018-01-14 DIAGNOSIS — M25611 Stiffness of right shoulder, not elsewhere classified: Secondary | ICD-10-CM | POA: Diagnosis not present

## 2018-01-19 DIAGNOSIS — M25611 Stiffness of right shoulder, not elsewhere classified: Secondary | ICD-10-CM | POA: Diagnosis not present

## 2018-01-21 DIAGNOSIS — M25611 Stiffness of right shoulder, not elsewhere classified: Secondary | ICD-10-CM | POA: Diagnosis not present

## 2018-01-26 DIAGNOSIS — M25611 Stiffness of right shoulder, not elsewhere classified: Secondary | ICD-10-CM | POA: Diagnosis not present

## 2018-01-28 DIAGNOSIS — M25611 Stiffness of right shoulder, not elsewhere classified: Secondary | ICD-10-CM | POA: Diagnosis not present

## 2018-02-02 DIAGNOSIS — M25611 Stiffness of right shoulder, not elsewhere classified: Secondary | ICD-10-CM | POA: Diagnosis not present

## 2018-02-04 DIAGNOSIS — M25611 Stiffness of right shoulder, not elsewhere classified: Secondary | ICD-10-CM | POA: Diagnosis not present

## 2018-02-08 DIAGNOSIS — M25611 Stiffness of right shoulder, not elsewhere classified: Secondary | ICD-10-CM | POA: Diagnosis not present

## 2018-02-09 DIAGNOSIS — M25611 Stiffness of right shoulder, not elsewhere classified: Secondary | ICD-10-CM | POA: Diagnosis not present

## 2018-02-15 ENCOUNTER — Other Ambulatory Visit: Payer: Self-pay | Admitting: Family Medicine

## 2018-02-15 DIAGNOSIS — Z5189 Encounter for other specified aftercare: Secondary | ICD-10-CM | POA: Diagnosis not present

## 2018-02-16 DIAGNOSIS — M25611 Stiffness of right shoulder, not elsewhere classified: Secondary | ICD-10-CM | POA: Diagnosis not present

## 2018-02-16 NOTE — Telephone Encounter (Signed)
Last filled 11/23/16  Last OV 08/17/17  Ok to fill?

## 2018-02-16 NOTE — Telephone Encounter (Signed)
Wife called, she said that pt has a headache and needs medication,

## 2018-02-18 DIAGNOSIS — M25611 Stiffness of right shoulder, not elsewhere classified: Secondary | ICD-10-CM | POA: Diagnosis not present

## 2018-02-18 NOTE — Telephone Encounter (Signed)
Patient is calling to check on the status of this being called in. Please advise.

## 2018-02-23 DIAGNOSIS — M25611 Stiffness of right shoulder, not elsewhere classified: Secondary | ICD-10-CM | POA: Diagnosis not present

## 2018-02-25 DIAGNOSIS — M25611 Stiffness of right shoulder, not elsewhere classified: Secondary | ICD-10-CM | POA: Diagnosis not present

## 2018-03-01 DIAGNOSIS — M25611 Stiffness of right shoulder, not elsewhere classified: Secondary | ICD-10-CM | POA: Diagnosis not present

## 2018-03-03 DIAGNOSIS — M25611 Stiffness of right shoulder, not elsewhere classified: Secondary | ICD-10-CM | POA: Diagnosis not present

## 2018-03-12 DIAGNOSIS — M25611 Stiffness of right shoulder, not elsewhere classified: Secondary | ICD-10-CM | POA: Diagnosis not present

## 2018-03-15 DIAGNOSIS — M25611 Stiffness of right shoulder, not elsewhere classified: Secondary | ICD-10-CM | POA: Diagnosis not present

## 2018-03-16 DIAGNOSIS — M25611 Stiffness of right shoulder, not elsewhere classified: Secondary | ICD-10-CM | POA: Diagnosis not present

## 2018-03-25 DIAGNOSIS — L03113 Cellulitis of right upper limb: Secondary | ICD-10-CM | POA: Diagnosis not present

## 2018-03-30 DIAGNOSIS — L03113 Cellulitis of right upper limb: Secondary | ICD-10-CM | POA: Diagnosis not present

## 2018-04-03 ENCOUNTER — Other Ambulatory Visit: Payer: Self-pay | Admitting: Family Medicine

## 2018-04-06 DIAGNOSIS — L03113 Cellulitis of right upper limb: Secondary | ICD-10-CM | POA: Diagnosis not present

## 2018-05-17 DIAGNOSIS — Z96611 Presence of right artificial shoulder joint: Secondary | ICD-10-CM | POA: Diagnosis not present

## 2018-05-17 DIAGNOSIS — Z471 Aftercare following joint replacement surgery: Secondary | ICD-10-CM | POA: Diagnosis not present

## 2018-05-19 ENCOUNTER — Other Ambulatory Visit: Payer: Self-pay | Admitting: Family Medicine

## 2018-05-19 NOTE — Telephone Encounter (Signed)
Dr. Sarajane Jews please advise on the refill of the maxalt.  Thanks

## 2018-06-21 IMAGING — DX DG RIBS 2V*R*
2 series · 2 of 2 positions shown · non-contrast
Comparison: 09/12/2013

CLINICAL DATA: Right anterior lower rib pain for 8 weeks

EXAM:
RIGHT RIBS - 2 VIEW

[rib pa]
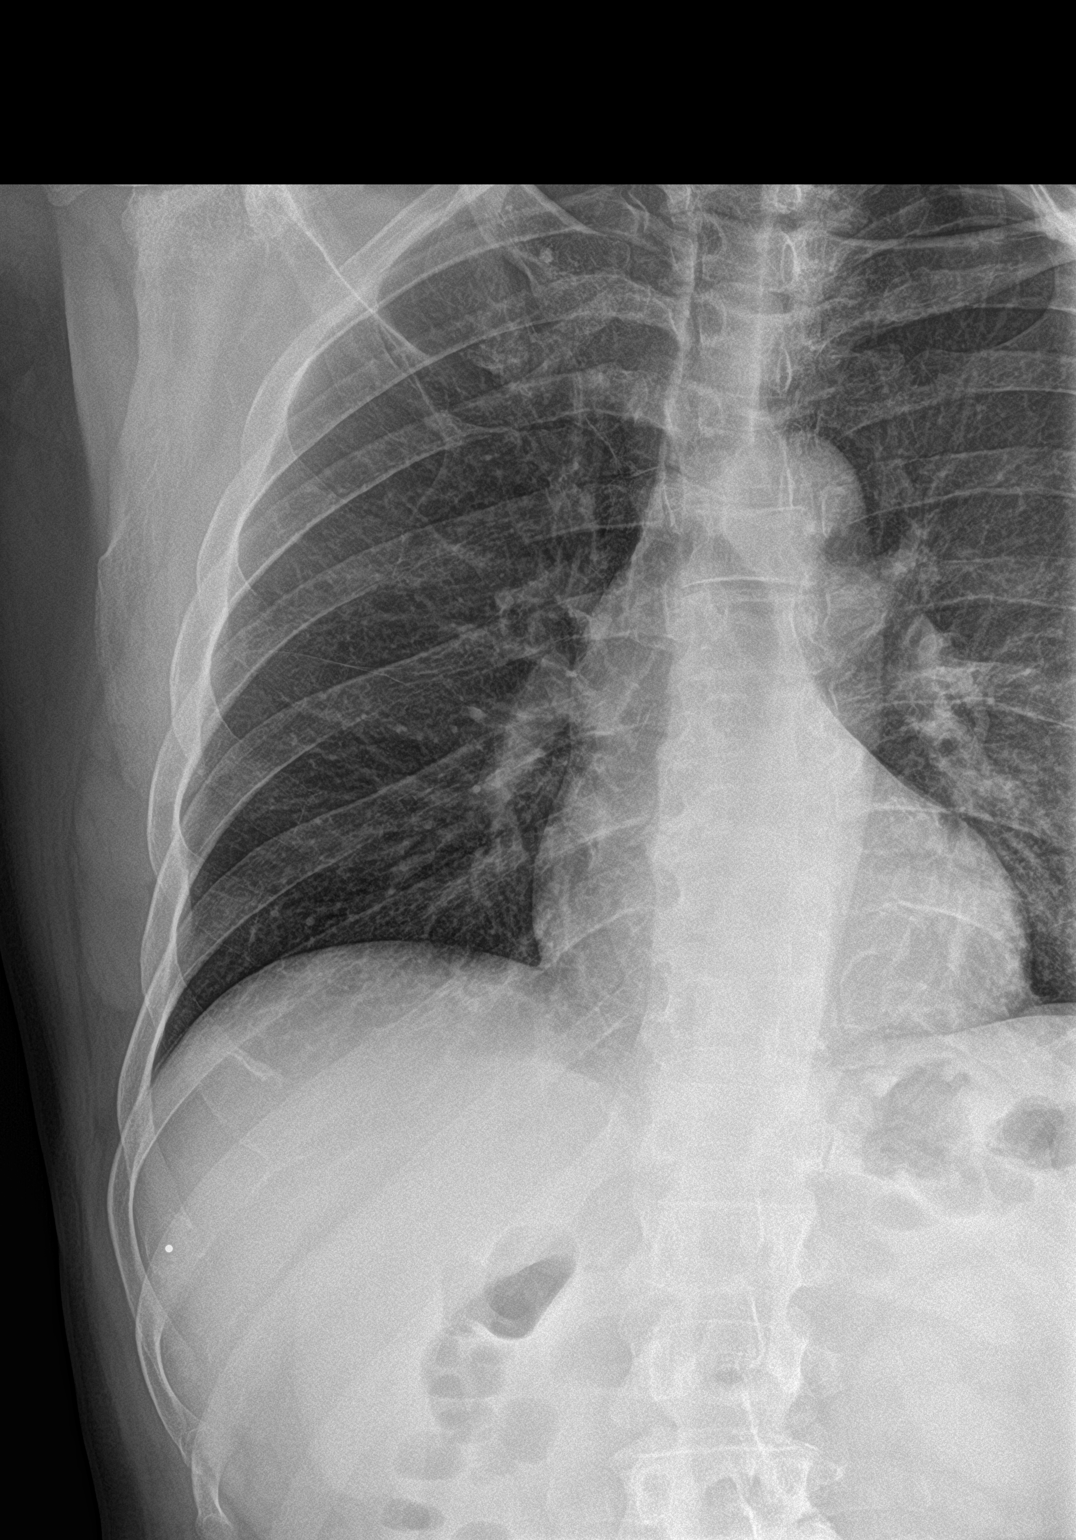

[rib obl]
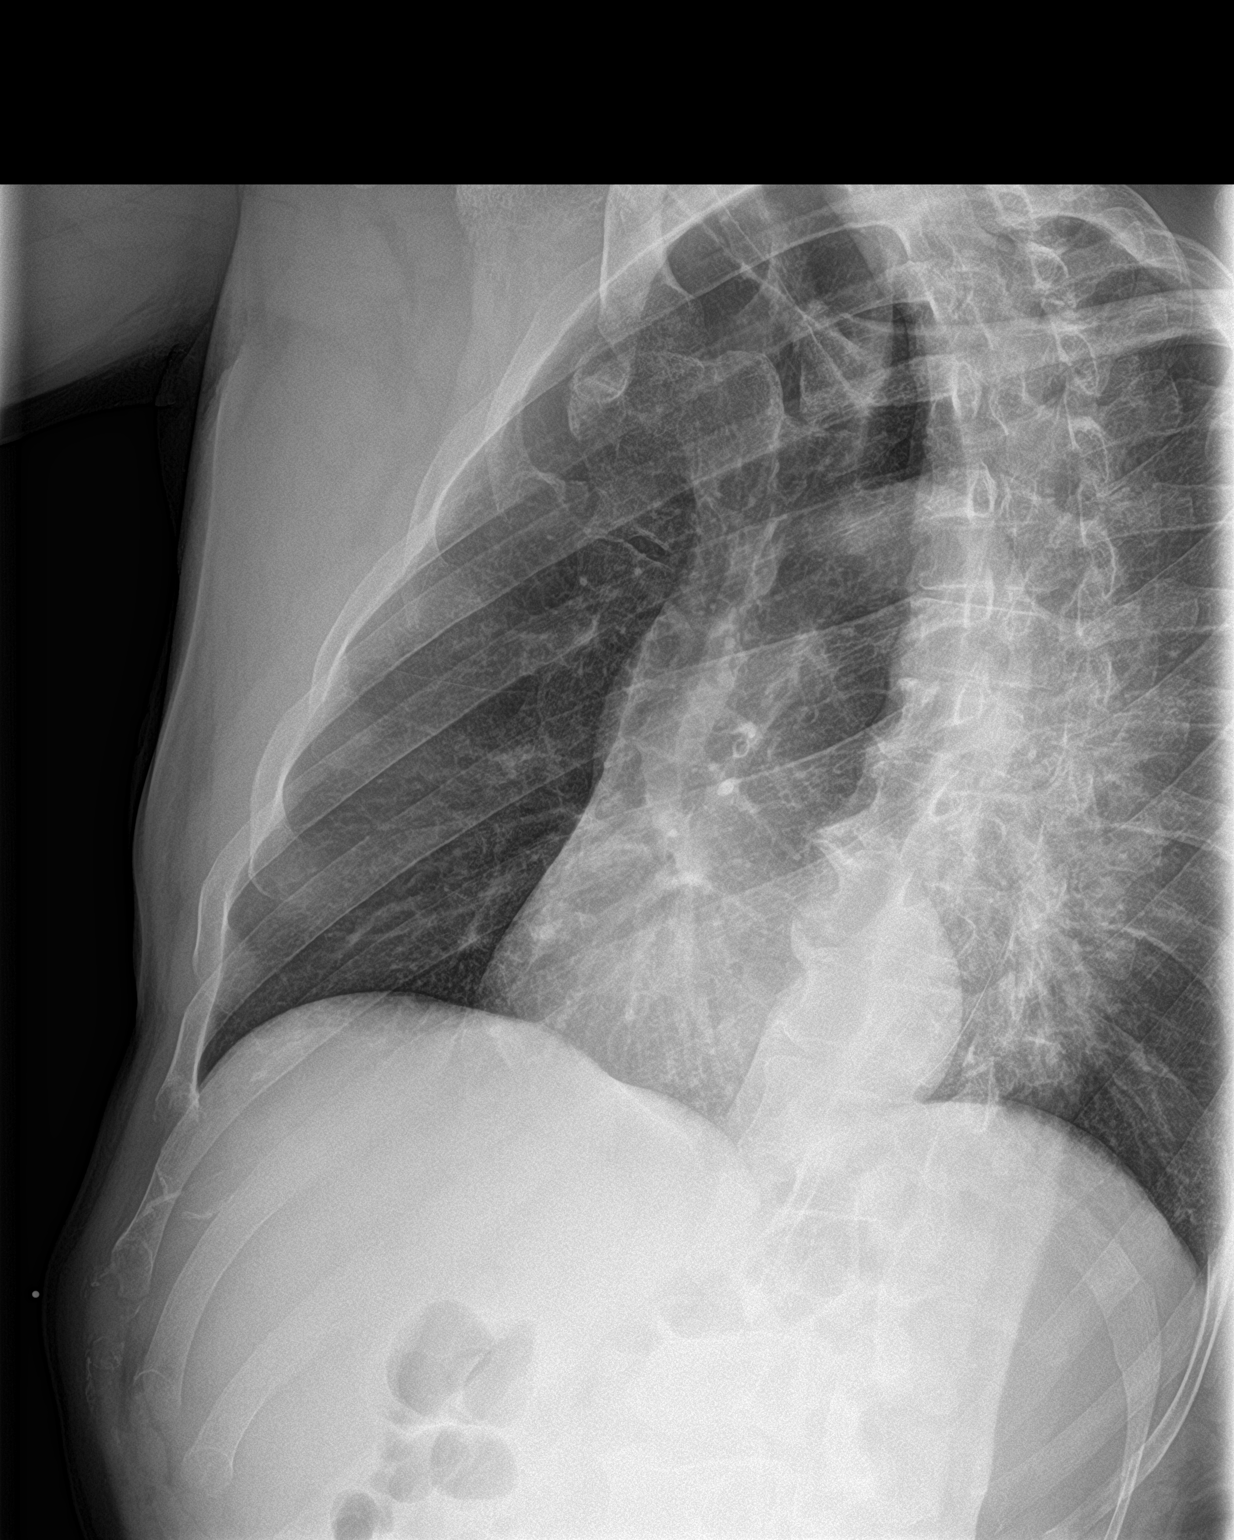

[2 of 2 positions shown; findings below may reference images not displayed]

FINDINGS: No fracture or other bone lesions are seen involving the ribs.
IMPRESSION: Negative.

## 2018-08-03 ENCOUNTER — Other Ambulatory Visit: Payer: Self-pay | Admitting: Family Medicine

## 2018-09-07 ENCOUNTER — Ambulatory Visit (INDEPENDENT_AMBULATORY_CARE_PROVIDER_SITE_OTHER): Payer: 59 | Admitting: Family Medicine

## 2018-09-07 ENCOUNTER — Other Ambulatory Visit: Payer: Self-pay

## 2018-09-07 ENCOUNTER — Encounter: Payer: Self-pay | Admitting: Family Medicine

## 2018-09-07 VITALS — BP 110/68 | HR 76 | Temp 97.8°F | Ht 72.0 in | Wt 185.4 lb

## 2018-09-07 DIAGNOSIS — L989 Disorder of the skin and subcutaneous tissue, unspecified: Secondary | ICD-10-CM

## 2018-09-07 DIAGNOSIS — Z0000 Encounter for general adult medical examination without abnormal findings: Secondary | ICD-10-CM

## 2018-09-07 NOTE — Progress Notes (Signed)
Subjective:    Patient ID: Ricky Freeman, male    DOB: 1960-08-01, 58 y.o.   MRN: [id number]  HPI Here for a well exam. He feels well. He is recovering from a right shoulder total arthroplasty last September. He asks me to check a spot on the right temple that came up 2 years ago. It is slowly enlarging.    Review of Systems  Constitutional: Negative.   HENT: Negative.   Eyes: Negative.   Respiratory: Negative.   Cardiovascular: Negative.   Gastrointestinal: Negative.   Genitourinary: Negative.   Musculoskeletal: Negative.   Skin: Negative.   Neurological: Negative.   Psychiatric/Behavioral: Negative.        Objective:   Physical Exam Constitutional:      General: He is not in acute distress.    Appearance: He is well-developed. He is not diaphoretic.  HENT:     Head: Normocephalic and atraumatic.     Right Ear: External ear normal.     Left Ear: External ear normal.     Nose: Nose normal.     Mouth/Throat:     Pharynx: No oropharyngeal exudate.  Eyes:     General: No scleral icterus.       Right eye: No discharge.        Left eye: No discharge.     Conjunctiva/sclera: Conjunctivae normal.     Pupils: Pupils are equal, round, and reactive to light.  Neck:     Musculoskeletal: Neck supple.     Thyroid: No thyromegaly.     Vascular: No JVD.     Trachea: No tracheal deviation.  Cardiovascular:     Rate and Rhythm: Normal rate and regular rhythm.     Heart sounds: Normal heart sounds. No murmur. No friction rub. No gallop.   Pulmonary:     Effort: Pulmonary effort is normal. No respiratory distress.     Breath sounds: Normal breath sounds. No wheezing or rales.  Chest:     Chest wall: No tenderness.  Abdominal:     General: Bowel sounds are normal. There is no distension.     Palpations: Abdomen is soft. There is no mass.     Tenderness: There is no abdominal tenderness. There is no guarding or rebound.  Genitourinary:    Penis: Normal. No tenderness.    Prostate: Normal.     Rectum: Normal. Guaiac result negative.  Musculoskeletal: Normal range of motion.        General: No tenderness.  Lymphadenopathy:     Cervical: No cervical adenopathy.  Skin:    General: Skin is warm and dry.     Coloration: Skin is not pale.     Findings: No erythema or rash.     Comments: The right temple has a pink 4 mm lesion that is macular on one side and has a raised border on the other side   Neurological:     Mental Status: He is alert and oriented to person, place, and time.     Cranial Nerves: No cranial nerve deficit.     Motor: No abnormal muscle tone.     Coordination: Coordination normal.     Deep Tendon Reflexes: Reflexes are normal and symmetric. Reflexes normal.  Psychiatric:        Behavior: Behavior normal.        Thought Content: Thought content normal.        Judgment: Judgment normal.  Assessment & Plan:  Well exam. We discussed diet and exercise. Set up fasting labs soon. The temple lesion is likely a basal cell cancer, so we will refer to Dermatology to remove this.  Alysia Penna, MD

## 2018-09-09 ENCOUNTER — Other Ambulatory Visit (INDEPENDENT_AMBULATORY_CARE_PROVIDER_SITE_OTHER): Payer: 59

## 2018-09-09 ENCOUNTER — Other Ambulatory Visit: Payer: Self-pay

## 2018-09-09 DIAGNOSIS — Z0000 Encounter for general adult medical examination without abnormal findings: Secondary | ICD-10-CM | POA: Diagnosis not present

## 2018-09-09 LAB — CBC WITH DIFFERENTIAL/PLATELET
Basophils Absolute: 0 10*3/uL (ref 0.0–0.1)
Basophils Relative: 1 % (ref 0.0–3.0)
Eosinophils Absolute: 0.1 10*3/uL (ref 0.0–0.7)
Eosinophils Relative: 1.8 % (ref 0.0–5.0)
HCT: 42.8 % (ref 39.0–52.0)
Hemoglobin: 14.4 g/dL (ref 13.0–17.0)
Lymphocytes Relative: 40.7 % (ref 12.0–46.0)
Lymphs Abs: 1.8 10*3/uL (ref 0.7–4.0)
MCHC: 33.6 g/dL (ref 30.0–36.0)
MCV: 87.1 fl (ref 78.0–100.0)
Monocytes Absolute: 0.5 10*3/uL (ref 0.1–1.0)
Monocytes Relative: 11.8 % (ref 3.0–12.0)
Neutro Abs: 2 10*3/uL (ref 1.4–7.7)
Neutrophils Relative %: 44.7 % (ref 43.0–77.0)
Platelets: 228 10*3/uL (ref 150.0–400.0)
RBC: 4.92 Mil/uL (ref 4.22–5.81)
RDW: 14.5 % (ref 11.5–15.5)
WBC: 4.5 10*3/uL (ref 4.0–10.5)

## 2018-09-09 LAB — BASIC METABOLIC PANEL
BUN: 17 mg/dL (ref 6–23)
CO2: 29 mEq/L (ref 19–32)
Calcium: 8.9 mg/dL (ref 8.4–10.5)
Chloride: 103 mEq/L (ref 96–112)
Creatinine, Ser: 1.25 mg/dL (ref 0.40–1.50)
GFR: 59.72 mL/min — ABNORMAL LOW (ref >60.00)
Glucose, Bld: 106 mg/dL — ABNORMAL HIGH (ref 70–99)
Potassium: 4.8 mEq/L (ref 3.5–5.1)
Sodium: 138 mEq/L (ref 135–145)

## 2018-09-09 LAB — POC URINALSYSI DIPSTICK (AUTOMATED)
Bilirubin, UA: NEGATIVE
Blood, UA: NEGATIVE
Glucose, UA: NEGATIVE
Ketones, UA: NEGATIVE
Leukocytes, UA: NEGATIVE
Nitrite, UA: NEGATIVE
Protein, UA: NEGATIVE
Spec Grav, UA: 1.015 (ref 1.010–1.025)
Urobilinogen, UA: 0.2 E.U./dL
pH, UA: 6 (ref 5.0–8.0)

## 2018-09-09 LAB — HEPATIC FUNCTION PANEL
ALT: 25 U/L (ref 0–53)
AST: 25 U/L (ref 0–37)
Albumin: 4.1 g/dL (ref 3.5–5.2)
Alkaline Phosphatase: 76 U/L (ref 39–117)
Bilirubin, Direct: 0.2 mg/dL (ref 0.0–0.3)
Total Bilirubin: 1.5 mg/dL — ABNORMAL HIGH (ref 0.2–1.2)
Total Protein: 6.5 g/dL (ref 6.0–8.3)

## 2018-09-09 LAB — LIPID PANEL
Cholesterol: 147 mg/dL (ref 0–200)
HDL: 42.9 mg/dL (ref >39.00)
LDL Cholesterol: 90 mg/dL (ref 0–99)
NonHDL: 103.94
Total CHOL/HDL Ratio: 3
Triglycerides: 71 mg/dL (ref 0.0–149.0)
VLDL: 14.2 mg/dL (ref 0.0–40.0)

## 2018-09-09 LAB — PSA: PSA: 3.09 ng/mL (ref 0.10–4.00)

## 2018-09-09 LAB — TSH: TSH: 2.08 u[IU]/mL (ref 0.35–4.50)

## 2018-09-14 ENCOUNTER — Encounter: Payer: Self-pay | Admitting: Family Medicine

## 2018-09-14 NOTE — Telephone Encounter (Signed)
See my Result Note  

## 2018-09-15 ENCOUNTER — Encounter: Payer: Self-pay | Admitting: *Deleted

## 2018-09-21 ENCOUNTER — Encounter: Payer: Self-pay | Admitting: Family Medicine

## 2018-11-02 ENCOUNTER — Encounter: Payer: Self-pay | Admitting: Family Medicine

## 2018-11-02 NOTE — Telephone Encounter (Signed)
Form has been placed in Dr. Barbie Banner red folder.

## 2018-11-03 NOTE — Telephone Encounter (Signed)
The form is ready  

## 2018-11-15 ENCOUNTER — Encounter: Payer: Self-pay | Admitting: Family Medicine

## 2018-11-15 DIAGNOSIS — L989 Disorder of the skin and subcutaneous tissue, unspecified: Secondary | ICD-10-CM

## 2018-11-15 NOTE — Telephone Encounter (Signed)
Please advise. Last referral is from 09/07/2018.

## 2018-11-15 NOTE — Telephone Encounter (Signed)
I just sent another referral

## 2018-11-18 ENCOUNTER — Encounter: Payer: Self-pay | Admitting: Family Medicine

## 2019-02-01 ENCOUNTER — Other Ambulatory Visit: Payer: Self-pay | Admitting: Family Medicine

## 2019-02-02 NOTE — Telephone Encounter (Signed)
Okay for refill?  

## 2019-06-02 ENCOUNTER — Telehealth: Payer: Self-pay | Admitting: Family Medicine

## 2019-06-02 NOTE — Telephone Encounter (Signed)
Pt states he is needing a referral for trigger finger. No preference on who.  Pt can be reached at [phone]

## 2019-06-02 NOTE — Telephone Encounter (Signed)
Message Routed to PCP  for approval. 

## 2019-06-03 NOTE — Telephone Encounter (Signed)
He will need an OV for this

## 2019-06-06 ENCOUNTER — Other Ambulatory Visit: Payer: Self-pay

## 2019-06-06 ENCOUNTER — Telehealth (INDEPENDENT_AMBULATORY_CARE_PROVIDER_SITE_OTHER): Payer: 59 | Admitting: Family Medicine

## 2019-06-06 DIAGNOSIS — M65311 Trigger thumb, right thumb: Secondary | ICD-10-CM | POA: Diagnosis not present

## 2019-06-06 NOTE — Telephone Encounter (Signed)
Appointment scheduled for today at 1:30 pm, vv okay per Dr. Sarajane Jews.

## 2019-06-06 NOTE — Progress Notes (Signed)
Virtual Visit via Video Note  I connected with the patient on 06/06/19 at  1:30 PM EDT by a video enabled telemedicine application and verified that I am speaking with the correct person using two identifiers.  Location patient: home Location provider:work or home office Persons participating in the virtual visit: patient, provider  I discussed the limitations of evaluation and management by telemedicine and the availability of in person appointments. The patient expressed understanding and agreed to proceed.   HPI: Here for an apparent trigger finger. About 6 months ago his right (dominant) thumb began to have pain and stiffness in the IP joint. He has tried Aleve and Voltaren gel with no relief. Now the thumb is getting stuck in a bent position and it clicks when he bends it.    ROS: See pertinent positives and negatives per HPI.  Past Medical History:  Diagnosis Date  . Arthritis    "joints" (03/26/2017)  . Basal cell carcinoma 2000s   "burned off my chest"  . Impingement syndrome of left shoulder 11/2015  . Left bundle branch block (LBBB) 2015   Coronary CTA with calcium score of 0 and normal coronary anatomy  . Rotator cuff strain 11/2015   left    Past Surgical History:  Procedure Laterality Date  . COLONOSCOPY WITH PROPOFOL  09/20/2015   per Dr. Hilarie Fredrickson, adenomatous polyps, repeat in 5 yrs   . JOINT REPLACEMENT    . LASIK Bilateral   . SHOULDER ARTHROSCOPY WITH ROTATOR CUFF REPAIR Left 11/2015  . SHOULDER OPEN ROTATOR CUFF REPAIR Left 1992   Dr. Percell Miller  . TONSILLECTOMY    . TOTAL SHOULDER ARTHROPLASTY Left 03/26/2017   Procedure: TOTAL SHOULDER ARTHROPLASTY;  Surgeon: Justice Britain, MD;  Location: [facility];  Service: Orthopedics;  Laterality: Left;  . TOTAL SHOULDER ARTHROPLASTY Right 11/26/2017   Procedure: RIGHT TOTAL SHOULDER ARTHROPLASTY;  Surgeon: Justice Britain, MD;  Location: [facility];  Service: Orthopedics;  Laterality: Right;  . TOTAL SHOULDER REPLACEMENT Left  03/26/2017  . VARICOCELECTOMY Bilateral 09/14/2001  . WISDOM TOOTH EXTRACTION      Family History  Problem Relation Age of Onset  . Prostate cancer Father   . Colon cancer Mother 28       stage I colon ca with part colectomy     Current Outpatient Medications:  .  LORazepam (ATIVAN) 1 MG tablet, TAKE 1 TABLET EVERY 6 HOURS AS NEEDED, Disp: 60 tablet, Rfl: 5 .  methocarbamol (ROBAXIN) 500 MG tablet, Take 1 tablet (500 mg total) by mouth every 8 (eight) hours as needed for muscle spasms., Disp: 30 tablet, Rfl: 1 .  Multiple Vitamins-Minerals (MULTIVITAMIN GUMMIES ADULT PO), Take 1 tablet by mouth daily., Disp: , Rfl:  .  naproxen sodium (ALEVE) 220 MG tablet, Take 440 mg by mouth daily as needed (pain)., Disp: , Rfl:  .  ondansetron (ZOFRAN) 4 MG tablet, Take 1 tablet (4 mg total) by mouth every 8 (eight) hours as needed for nausea or vomiting., Disp: 10 tablet, Rfl: 0 .  oxyCODONE-acetaminophen (PERCOCET) 5-325 MG tablet, Take 1 tablet by mouth every 4 (four) hours as needed (max 6 q)., Disp: 30 tablet, Rfl: 0 .  rizatriptan (MAXALT) 10 MG tablet, TAKE 1 TABLET BY MOUTH AS NEEDED FOR MIGRAINE. MAY REPEAT DOSE ONCE IN 2 HOURS IF NEEDED, Disp: 10 tablet, Rfl: 11 .  Triamcinolone Acetonide (TRIAMCINOLONE 0.1 % CREAM : EUCERIN) CREA, Apply one application to effected area twice daily as needed for poison ivy (Patient taking differently:  Apply 1 application topically 2 (two) times daily as needed (for itching skin/poison ivy). ), Disp: 1 each, Rfl: 0 .  triamcinolone cream (KENALOG) 0.1 %, APPLY TO AFFECTED AREA TWICE A DAY AS NEEDED FOR POISON IVY, Disp: 30 g, Rfl: 0  EXAM:  VITALS per patient if applicable:  GENERAL: alert, oriented, appears well and in no acute distress  HEENT: atraumatic, conjunttiva clear, no obvious abnormalities on inspection of external nose and ears  NECK: normal movements of the head and neck  LUNGS: on inspection no signs of respiratory distress, breathing rate  appears normal, no obvious gross SOB, gasping or wheezing  CV: no obvious cyanosis  MS: moves all visible extremities without noticeable abnormality  PSYCH/NEURO: pleasant and cooperative, no obvious depression or anxiety, speech and thought processing grossly intact  ASSESSMENT AND PLAN: Trigger thumb. Refer to Hand Surgery.  Alysia Penna, MD  Discussed the following assessment and plan:  No diagnosis found.     I discussed the assessment and treatment plan with the patient. The patient was provided an opportunity to ask questions and all were answered. The patient agreed with the plan and demonstrated an understanding of the instructions.   The patient was advised to call back or seek an in-person evaluation if the symptoms worsen or if the condition fails to improve as anticipated.

## 2019-06-12 ENCOUNTER — Other Ambulatory Visit: Payer: Self-pay | Admitting: Family Medicine

## 2019-06-15 ENCOUNTER — Encounter: Payer: Self-pay | Admitting: Family Medicine

## 2019-06-16 ENCOUNTER — Ambulatory Visit: Payer: 59 | Attending: Internal Medicine

## 2019-06-16 DIAGNOSIS — Z23 Encounter for immunization: Secondary | ICD-10-CM

## 2019-06-16 NOTE — Progress Notes (Signed)
   Covid-19 Vaccination Clinic  Name:  Ricky Freeman    MRN: [id number] DOB: [birth year]  06/16/2019  [name] was observed post Covid-19 immunization for 15 minutes without incident. He was provided with Vaccine Information Sheet and instruction to access the V-Safe system.   [name] was instructed to call 911 with any severe reactions post vaccine: Marland Kitchen Difficulty breathing  . Swelling of face and throat  . A fast heartbeat  . A bad rash all over body  . Dizziness and weakness   Immunizations Administered    Name Date Dose VIS Date Route   Pfizer COVID-19 Vaccine 06/16/2019  2:34 PM 0.3 mL 02/25/2019 Intramuscular   Manufacturer: [place]   Lot: [id number]   [place]: [id number]

## 2019-07-11 ENCOUNTER — Ambulatory Visit: Payer: 59 | Attending: Internal Medicine

## 2019-07-11 DIAGNOSIS — Z23 Encounter for immunization: Secondary | ICD-10-CM

## 2019-07-11 NOTE — Progress Notes (Signed)
   Covid-19 Vaccination Clinic  Name:  GWENDOLYN CARTHAN    MRN: [id number] DOB: September 06, 1960  07/11/2019  Mr. Fairfield was observed post Covid-19 immunization for 15 minutes without incident. He was provided with Vaccine Information Sheet and instruction to access the V-Safe system.   Mr. Broadwater was instructed to call 911 with any severe reactions post vaccine: Marland Kitchen Difficulty breathing  . Swelling of face and throat  . A fast heartbeat  . A bad rash all over body  . Dizziness and weakness   Immunizations Administered    Name Date Dose VIS Date Route   Pfizer COVID-19 Vaccine 07/11/2019 10:17 AM 0.3 mL 05/11/2018 Intramuscular   Manufacturer: [place]   Lot: [id number]   [place]: [id number]

## 2019-09-08 ENCOUNTER — Encounter: Payer: 59 | Admitting: Family Medicine

## 2019-09-13 ENCOUNTER — Other Ambulatory Visit: Payer: Self-pay

## 2019-09-13 ENCOUNTER — Ambulatory Visit (INDEPENDENT_AMBULATORY_CARE_PROVIDER_SITE_OTHER): Payer: 59 | Admitting: Family Medicine

## 2019-09-13 ENCOUNTER — Encounter: Payer: Self-pay | Admitting: Family Medicine

## 2019-09-13 VITALS — BP 140/72 | HR 98 | Temp 97.2°F | Ht 72.0 in | Wt 184.2 lb

## 2019-09-13 DIAGNOSIS — Z0000 Encounter for general adult medical examination without abnormal findings: Secondary | ICD-10-CM | POA: Diagnosis not present

## 2019-09-13 LAB — BASIC METABOLIC PANEL
BUN: 11 mg/dL (ref 6–23)
CO2: 29 mEq/L (ref 19–32)
Calcium: 9.1 mg/dL (ref 8.4–10.5)
Chloride: 103 mEq/L (ref 96–112)
Creatinine, Ser: 1.15 mg/dL (ref 0.40–1.50)
GFR: 65.52 mL/min (ref >60.00)
Glucose, Bld: 102 mg/dL — ABNORMAL HIGH (ref 70–99)
Potassium: 4.1 mEq/L (ref 3.5–5.1)
Sodium: 138 mEq/L (ref 135–145)

## 2019-09-13 LAB — CBC WITH DIFFERENTIAL/PLATELET
Basophils Absolute: 0.1 10*3/uL (ref 0.0–0.1)
Basophils Relative: 1.2 % (ref 0.0–3.0)
Eosinophils Absolute: 0.1 10*3/uL (ref 0.0–0.7)
Eosinophils Relative: 1.4 % (ref 0.0–5.0)
HCT: 41.6 % (ref 39.0–52.0)
Hemoglobin: 14.2 g/dL (ref 13.0–17.0)
Lymphocytes Relative: 32.1 % (ref 12.0–46.0)
Lymphs Abs: 1.6 10*3/uL (ref 0.7–4.0)
MCHC: 34.2 g/dL (ref 30.0–36.0)
MCV: 89.6 fl (ref 78.0–100.0)
Monocytes Absolute: 0.5 10*3/uL (ref 0.1–1.0)
Monocytes Relative: 10.5 % (ref 3.0–12.0)
Neutro Abs: 2.8 10*3/uL (ref 1.4–7.7)
Neutrophils Relative %: 54.8 % (ref 43.0–77.0)
Platelets: 219 10*3/uL (ref 150.0–400.0)
RBC: 4.64 Mil/uL (ref 4.22–5.81)
RDW: 13.7 % (ref 11.5–15.5)
WBC: 5.1 10*3/uL (ref 4.0–10.5)

## 2019-09-13 LAB — LIPID PANEL
Cholesterol: 175 mg/dL (ref 0–200)
HDL: 48.3 mg/dL (ref >39.00)
LDL Cholesterol: 104 mg/dL — ABNORMAL HIGH (ref 0–99)
NonHDL: 126.25
Total CHOL/HDL Ratio: 4
Triglycerides: 109 mg/dL (ref 0.0–149.0)
VLDL: 21.8 mg/dL (ref 0.0–40.0)

## 2019-09-13 LAB — HEPATIC FUNCTION PANEL
ALT: 35 U/L (ref 0–53)
AST: 26 U/L (ref 0–37)
Albumin: 4.2 g/dL (ref 3.5–5.2)
Alkaline Phosphatase: 75 U/L (ref 39–117)
Bilirubin, Direct: 0.2 mg/dL (ref 0.0–0.3)
Total Bilirubin: 1.3 mg/dL — ABNORMAL HIGH (ref 0.2–1.2)
Total Protein: 6.4 g/dL (ref 6.0–8.3)

## 2019-09-13 LAB — TSH: TSH: 1.84 u[IU]/mL (ref 0.35–4.50)

## 2019-09-13 LAB — PSA: PSA: 2.1 ng/mL (ref 0.10–4.00)

## 2019-09-13 NOTE — Progress Notes (Signed)
   Subjective:    Patient ID: Ricky Freeman, male    DOB: 09-08-1960, 59 y.o.   MRN: [id number]  HPI Here for a well exam. He feels great.    Review of Systems  Constitutional: Negative.   HENT: Negative.   Eyes: Negative.   Respiratory: Negative.   Cardiovascular: Negative.   Gastrointestinal: Negative.   Genitourinary: Negative.   Musculoskeletal: Negative.   Skin: Negative.   Neurological: Negative.   Psychiatric/Behavioral: Negative.        Objective:   Physical Exam Constitutional:      General: He is not in acute distress.    Appearance: He is well-developed. He is not diaphoretic.  HENT:     Head: Normocephalic and atraumatic.     Right Ear: External ear normal.     Left Ear: External ear normal.     Nose: Nose normal.     Mouth/Throat:     Pharynx: No oropharyngeal exudate.  Eyes:     General: No scleral icterus.       Right eye: No discharge.        Left eye: No discharge.     Conjunctiva/sclera: Conjunctivae normal.     Pupils: Pupils are equal, round, and reactive to light.  Neck:     Thyroid: No thyromegaly.     Vascular: No JVD.     Trachea: No tracheal deviation.  Cardiovascular:     Rate and Rhythm: Normal rate and regular rhythm.     Heart sounds: Normal heart sounds. No murmur heard.  No friction rub. No gallop.   Pulmonary:     Effort: Pulmonary effort is normal. No respiratory distress.     Breath sounds: Normal breath sounds. No wheezing or rales.  Chest:     Chest wall: No tenderness.  Abdominal:     General: Bowel sounds are normal. There is no distension.     Palpations: Abdomen is soft. There is no mass.     Tenderness: There is no abdominal tenderness. There is no guarding or rebound.  Genitourinary:    Penis: Normal. No tenderness.      Testes: Normal.     Prostate: Normal.     Rectum: Normal. Guaiac result negative.  Musculoskeletal:        General: No tenderness. Normal range of motion.     Cervical back: Neck supple.    Lymphadenopathy:     Cervical: No cervical adenopathy.  Skin:    General: Skin is warm and dry.     Coloration: Skin is not pale.     Findings: No erythema or rash.  Neurological:     Mental Status: He is alert and oriented to person, place, and time.     Cranial Nerves: No cranial nerve deficit.     Motor: No abnormal muscle tone.     Coordination: Coordination normal.     Deep Tendon Reflexes: Reflexes are normal and symmetric. Reflexes normal.  Psychiatric:        Behavior: Behavior normal.        Thought Content: Thought content normal.        Judgment: Judgment normal.           Assessment & Plan:  Well exam. We discussed diet and exercise. Get fasting labs.  Alysia Penna, MD

## 2019-09-14 ENCOUNTER — Encounter: Payer: Self-pay | Admitting: Family Medicine

## 2019-09-15 NOTE — Telephone Encounter (Signed)
I highly recommend his daughter get the vaccine. Any side effects she has heard about are extremely rare. I think everyone should get it.

## 2019-11-30 ENCOUNTER — Other Ambulatory Visit: Payer: Self-pay | Admitting: Family Medicine

## 2019-12-01 NOTE — Telephone Encounter (Signed)
Last OV: 09/13/19 Last refill: 02/04/19 #60/5 refills

## 2019-12-13 ENCOUNTER — Other Ambulatory Visit: Payer: Self-pay | Admitting: Family Medicine

## 2019-12-13 NOTE — Telephone Encounter (Signed)
Patient called and requested refill on Maxalt. Last filled 05/2019. Refill sent.

## 2019-12-13 NOTE — Telephone Encounter (Signed)
pt need a refill requset  rizatriptan (MAXALT) 10 MG tablet [id number] - [place], [place] [address]  Phone:  [phone] Fax:  [phone]

## 2019-12-22 ENCOUNTER — Telehealth: Payer: Self-pay | Admitting: Family Medicine

## 2019-12-22 MED ORDER — RIZATRIPTAN BENZOATE 10 MG PO TABS: ORAL_TABLET | ORAL | 0 refills | Status: DC

## 2019-12-22 NOTE — Telephone Encounter (Signed)
Pt needs a refill on Rizatriptan.  He stated he called in earlier this week and it was never called in.  Pt is now out of medication.  Pharmacy- [facility] [address]

## 2019-12-22 NOTE — Telephone Encounter (Signed)
Refill sent.

## 2020-02-03 ENCOUNTER — Other Ambulatory Visit: Payer: Self-pay | Admitting: Family Medicine

## 2020-07-31 ENCOUNTER — Other Ambulatory Visit: Payer: Self-pay | Admitting: Family Medicine

## 2020-09-20 ENCOUNTER — Other Ambulatory Visit: Payer: Self-pay

## 2020-09-21 ENCOUNTER — Encounter: Payer: Self-pay | Admitting: Family Medicine

## 2020-09-21 ENCOUNTER — Ambulatory Visit (INDEPENDENT_AMBULATORY_CARE_PROVIDER_SITE_OTHER): Payer: 59 | Admitting: Family Medicine

## 2020-09-21 VITALS — BP 98/70 | HR 56 | Temp 97.6°F | Ht 71.5 in | Wt 182.1 lb

## 2020-09-21 DIAGNOSIS — Z86010 Personal history of colonic polyps: Secondary | ICD-10-CM

## 2020-09-21 DIAGNOSIS — Z0000 Encounter for general adult medical examination without abnormal findings: Secondary | ICD-10-CM | POA: Diagnosis not present

## 2020-09-21 LAB — CBC WITH DIFFERENTIAL/PLATELET
Basophils Absolute: 0 10*3/uL (ref 0.0–0.1)
Basophils Relative: 0.9 % (ref 0.0–3.0)
Eosinophils Absolute: 0.1 10*3/uL (ref 0.0–0.7)
Eosinophils Relative: 1.9 % (ref 0.0–5.0)
HCT: 41.5 % (ref 39.0–52.0)
Hemoglobin: 14.3 g/dL (ref 13.0–17.0)
Lymphocytes Relative: 35.9 % (ref 12.0–46.0)
Lymphs Abs: 1.6 10*3/uL (ref 0.7–4.0)
MCHC: 34.4 g/dL (ref 30.0–36.0)
MCV: 87.2 fl (ref 78.0–100.0)
Monocytes Absolute: 0.5 10*3/uL (ref 0.1–1.0)
Monocytes Relative: 10.9 % (ref 3.0–12.0)
Neutro Abs: 2.2 10*3/uL (ref 1.4–7.7)
Neutrophils Relative %: 50.4 % (ref 43.0–77.0)
Platelets: 232 10*3/uL (ref 150.0–400.0)
RBC: 4.75 Mil/uL (ref 4.22–5.81)
RDW: 13.6 % (ref 11.5–15.5)
WBC: 4.3 10*3/uL (ref 4.0–10.5)

## 2020-09-21 LAB — LIPID PANEL
Cholesterol: 158 mg/dL (ref 0–200)
HDL: 37.9 mg/dL — ABNORMAL LOW (ref >39.00)
LDL Cholesterol: 93 mg/dL (ref 0–99)
NonHDL: 120.24
Total CHOL/HDL Ratio: 4
Triglycerides: 136 mg/dL (ref 0.0–149.0)
VLDL: 27.2 mg/dL (ref 0.0–40.0)

## 2020-09-21 LAB — PSA: PSA: 2.37 ng/mL (ref 0.10–4.00)

## 2020-09-21 LAB — BASIC METABOLIC PANEL
BUN: 20 mg/dL (ref 6–23)
CO2: 29 mEq/L (ref 19–32)
Calcium: 9.1 mg/dL (ref 8.4–10.5)
Chloride: 104 mEq/L (ref 96–112)
Creatinine, Ser: 1.15 mg/dL (ref 0.40–1.50)
GFR: 70.33 mL/min (ref >60.00)
Glucose, Bld: 91 mg/dL (ref 70–99)
Potassium: 4.7 mEq/L (ref 3.5–5.1)
Sodium: 139 mEq/L (ref 135–145)

## 2020-09-21 LAB — HEPATIC FUNCTION PANEL
ALT: 24 U/L (ref 0–53)
AST: 24 U/L (ref 0–37)
Albumin: 4.2 g/dL (ref 3.5–5.2)
Alkaline Phosphatase: 80 U/L (ref 39–117)
Bilirubin, Direct: 0.2 mg/dL (ref 0.0–0.3)
Total Bilirubin: 1.8 mg/dL — ABNORMAL HIGH (ref 0.2–1.2)
Total Protein: 6.5 g/dL (ref 6.0–8.3)

## 2020-09-21 LAB — HEMOGLOBIN A1C: Hgb A1c MFr Bld: 5.7 % (ref 4.6–6.5)

## 2020-09-21 LAB — T4, FREE: Free T4: 0.83 ng/dL (ref 0.60–1.60)

## 2020-09-21 LAB — T3, FREE: T3, Free: 3.7 pg/mL (ref 2.3–4.2)

## 2020-09-21 LAB — TSH: TSH: 1.97 u[IU]/mL (ref 0.35–5.50)

## 2020-09-21 MED ORDER — RIZATRIPTAN BENZOATE 10 MG PO TABS: ORAL_TABLET | ORAL | 11 refills | Status: DC

## 2020-09-21 NOTE — Progress Notes (Signed)
   Subjective:    Patient ID: Ricky Freeman, male    DOB: [birth year], 60 y.o.   MRN: [id number]  HPI Here for a well exam. He feels fine.    Review of Systems  Constitutional: Negative.   HENT: Negative.    Eyes: Negative.   Respiratory: Negative.    Cardiovascular: Negative.   Gastrointestinal: Negative.   Genitourinary: Negative.   Musculoskeletal: Negative.   Skin: Negative.   Neurological: Negative.   Psychiatric/Behavioral: Negative.        Objective:   Physical Exam Constitutional:      General: He is not in acute distress.    Appearance: Normal appearance. He is well-developed. He is not diaphoretic.  HENT:     Head: Normocephalic and atraumatic.     Right Ear: External ear normal.     Left Ear: External ear normal.     Nose: Nose normal.     Mouth/Throat:     Pharynx: No oropharyngeal exudate.  Eyes:     General: No scleral icterus.       Right eye: No discharge.        Left eye: No discharge.     Conjunctiva/sclera: Conjunctivae normal.     Pupils: Pupils are equal, round, and reactive to light.  Neck:     Thyroid: No thyromegaly.     Vascular: No JVD.     Trachea: No tracheal deviation.  Cardiovascular:     Rate and Rhythm: Normal rate and regular rhythm.     Heart sounds: Normal heart sounds. No murmur heard.   No friction rub. No gallop.  Pulmonary:     Effort: Pulmonary effort is normal. No respiratory distress.     Breath sounds: Normal breath sounds. No wheezing or rales.  Chest:     Chest wall: No tenderness.  Abdominal:     General: Bowel sounds are normal. There is no distension.     Palpations: Abdomen is soft. There is no mass.     Tenderness: There is no abdominal tenderness. There is no guarding or rebound.  Genitourinary:    Penis: Normal. No tenderness.      Testes: Normal.     Prostate: Normal.     Rectum: Normal. Guaiac result negative.  Musculoskeletal:        General: No tenderness. Normal range of motion.     Cervical back:  Neck supple.  Lymphadenopathy:     Cervical: No cervical adenopathy.  Skin:    General: Skin is warm and dry.     Coloration: Skin is not pale.     Findings: No erythema or rash.  Neurological:     Mental Status: He is alert and oriented to person, place, and time.     Cranial Nerves: No cranial nerve deficit.     Motor: No abnormal muscle tone.     Coordination: Coordination normal.     Deep Tendon Reflexes: Reflexes are normal and symmetric. Reflexes normal.  Psychiatric:        Behavior: Behavior normal.        Thought Content: Thought content normal.        Judgment: Judgment normal.          Assessment & Plan:  Well exam. We discucssed diet and exercise. Get fasting labs. Set up another colonoscopy soon. Alysia Penna, MD

## 2020-09-21 NOTE — Addendum Note (Signed)
Addended by: Amanda Cockayne on: 09/21/2020 08:27 AM   Modules accepted: Orders

## 2020-09-27 ENCOUNTER — Encounter: Payer: Self-pay | Admitting: Internal Medicine

## 2020-11-09 ENCOUNTER — Other Ambulatory Visit: Payer: Self-pay | Admitting: Student

## 2020-11-09 DIAGNOSIS — M25551 Pain in right hip: Secondary | ICD-10-CM

## 2020-11-14 ENCOUNTER — Ambulatory Visit
Admission: RE | Admit: 2020-11-14 | Discharge: 2020-11-14 | Disposition: A | Discharge status: Home / Self Care | Payer: 59 | Source: Ambulatory Visit | Attending: Student | Admitting: Student

## 2020-11-14 ENCOUNTER — Other Ambulatory Visit: Payer: 59

## 2020-11-14 DIAGNOSIS — M25551 Pain in right hip: Secondary | ICD-10-CM

## 2020-11-14 MED ORDER — METHYLPREDNISOLONE ACETATE 40 MG/ML INJ SUSP (RADIOLOG
80.0000 mg | Freq: Once | INTRAMUSCULAR | Status: AC
  Administered 2020-11-14: 80 mg via INTRA_ARTICULAR

## 2020-11-14 MED ORDER — IOPAMIDOL (ISOVUE-M 200) INJECTION 41%
1.0000 mL | Freq: Once | INTRAMUSCULAR | Status: AC
  Administered 2020-11-14: 1 mL via INTRA_ARTICULAR

## 2020-12-24 ENCOUNTER — Ambulatory Visit (AMBULATORY_SURGERY_CENTER): Payer: 59 | Admitting: *Deleted

## 2020-12-24 ENCOUNTER — Other Ambulatory Visit: Payer: Self-pay

## 2020-12-24 VITALS — Ht 71.5 in | Wt 182.0 lb

## 2020-12-24 DIAGNOSIS — Z86010 Personal history of colonic polyps: Secondary | ICD-10-CM

## 2020-12-24 MED ORDER — NA SULFATE-K SULFATE-MG SULF 17.5-3.13-1.6 GM/177ML PO SOLN: 1.0000 | ORAL | 0 refills | Status: DC

## 2020-12-24 NOTE — Progress Notes (Signed)

## 2020-12-25 ENCOUNTER — Encounter: Payer: Self-pay | Admitting: Internal Medicine

## 2020-12-27 ENCOUNTER — Encounter: Payer: Self-pay | Admitting: Internal Medicine

## 2021-01-07 ENCOUNTER — Ambulatory Visit (AMBULATORY_SURGERY_CENTER): Payer: 59 | Admitting: Internal Medicine

## 2021-01-07 ENCOUNTER — Other Ambulatory Visit: Payer: Self-pay

## 2021-01-07 ENCOUNTER — Encounter: Payer: Self-pay | Admitting: Internal Medicine

## 2021-01-07 VITALS — BP 104/65 | HR 52 | Temp 97.3°F | Resp 11 | Ht 71.0 in | Wt 182.0 lb

## 2021-01-07 DIAGNOSIS — Z86010 Personal history of colonic polyps: Secondary | ICD-10-CM

## 2021-01-07 DIAGNOSIS — D123 Benign neoplasm of transverse colon: Secondary | ICD-10-CM

## 2021-01-07 HISTORY — PX: COLONOSCOPY WITH PROPOFOL: SHX5780

## 2021-01-07 MED ORDER — SODIUM CHLORIDE 0.9 % IV SOLN: 500.0000 mL | Freq: Once | INTRAVENOUS | Status: DC

## 2021-01-07 NOTE — Progress Notes (Signed)
GASTROENTEROLOGY PROCEDURE H&P NOTE   Primary Care Physician: Laurey Morale, MD    Reason for Procedure:  History of adenomatous and sessile serrated polyps  Plan:    Colonoscopy  Patient is appropriate for endoscopic procedure(s) in the ambulatory ([facility]) setting.  The nature of the procedure, as well as the risks, benefits, and alternatives were carefully and thoroughly reviewed with the patient. Ample time for discussion and questions allowed. The patient understood, was satisfied, and agreed to proceed.     HPI: Ricky Freeman is a 60 y.o. male who presents for colonoscopy.  Medical history as below.  Tolerated the prep.  No complaints today including chest pain, shortness of breath or abdominal pain.  Past Medical History:  Diagnosis Date   Arthritis    "joints" (03/26/2017)   Basal cell carcinoma 2000s   "burned off my chest"   Impingement syndrome of left shoulder 11/2015   Left bundle branch block (LBBB) 2015   Coronary CTA with calcium score of 0 and normal coronary anatomy   Rotator cuff strain 11/2015   left    Past Surgical History:  Procedure Laterality Date   COLONOSCOPY WITH PROPOFOL  09/20/2015   per Dr. Hilarie Fredrickson, adenomatous polyps, repeat in 5 yrs    JOINT REPLACEMENT     LASIK Bilateral    SHOULDER ARTHROSCOPY WITH ROTATOR CUFF REPAIR Left 11/2015   SHOULDER OPEN ROTATOR CUFF REPAIR Left 1992   Dr. [place] Left 03/26/2017   Procedure: TOTAL SHOULDER ARTHROPLASTY;  Surgeon: Justice Britain, MD;  Location: [facility];  Service: Orthopedics;  Laterality: Left;   TOTAL SHOULDER ARTHROPLASTY Right 11/26/2017   Procedure: RIGHT TOTAL SHOULDER ARTHROPLASTY;  Surgeon: Justice Britain, MD;  Location: [facility];  Service: Orthopedics;  Laterality: Right;   TOTAL SHOULDER REPLACEMENT Left 03/26/2017   VARICOCELECTOMY Bilateral 09/14/2001   WISDOM TOOTH EXTRACTION      Prior to Admission medications   Medication Sig Start  Date End Date Taking? Authorizing Provider  LORazepam (ATIVAN) 1 MG tablet TAKE 1 TABLET BY MOUTH EVERY 6 HOURS AS NEEDED 12/14/19  Yes Laurey Morale, MD  Multiple Vitamins-Minerals (MULTIVITAMIN GUMMIES ADULT PO) Take 1 tablet by mouth daily.   Yes [provider]  naproxen sodium (ALEVE) 220 MG tablet Take by mouth.   Yes [provider]  triamcinolone cream (KENALOG) 0.1 % APPLY TO AFFECTED AREA TWICE A DAY AS NEEDED FOR POISON IVY 08/06/18  Yes Laurey Morale, MD  diclofenac (VOLTAREN) 75 MG EC tablet Take 75 mg by mouth daily as needed.    [provider]  rizatriptan (MAXALT) 10 MG tablet May repeat in 2 hours if needed 09/21/20   Laurey Morale, MD    Current Outpatient Medications  Medication Sig Dispense Refill   LORazepam (ATIVAN) 1 MG tablet TAKE 1 TABLET BY MOUTH EVERY 6 HOURS AS NEEDED 60 tablet 5   Multiple Vitamins-Minerals (MULTIVITAMIN GUMMIES ADULT PO) Take 1 tablet by mouth daily.     naproxen sodium (ALEVE) 220 MG tablet Take by mouth.     triamcinolone cream (KENALOG) 0.1 % APPLY TO AFFECTED AREA TWICE A DAY AS NEEDED FOR POISON IVY 30 g 0   diclofenac (VOLTAREN) 75 MG EC tablet Take 75 mg by mouth daily as needed.     rizatriptan (MAXALT) 10 MG tablet May repeat in 2 hours if needed 10 tablet 11   Current Facility-Administered Medications  Medication Dose  Route Frequency Provider Last Rate Last Admin   0.9 %  sodium chloride infusion  500 mL Intravenous Once Caelen Higinbotham, Lajuan Lines, MD        Allergies as of 01/07/2021   (No Known Allergies)    Family History  Problem Relation Age of Onset   Colon cancer Mother 67       stage I colon ca with part colectomy   Prostate cancer Father    Esophageal cancer Neg Hx    Stomach cancer Neg Hx     Social History   Socioeconomic History   Marital status: Divorced    Spouse name: Not on file   Number of children: Not on file   Years of education: Not on file   Highest education level: Not on file   Occupational History   Not on file  Tobacco Use   Smoking status: Never   Smokeless tobacco: Never  Vaping Use   Vaping Use: Never used  Substance and Sexual Activity   Alcohol use: No   Drug use: No   Sexual activity: Not on file  Other Topics Concern   Not on file  Social History Narrative   Not on file   Social Determinants of Health   Financial Resource Strain: Not on file  Food Insecurity: Not on file  Transportation Needs: Not on file  Physical Activity: Not on file  Stress: Not on file  Social Connections: Not on file  Intimate Partner Violence: Not on file    Physical Exam: Vital signs in last 24 hours: @BP  129/76   Pulse 60   Temp (!) 97.3 F (36.3 C)   Ht 5\' 11"  (1.803 m)   Wt 182 lb (82.6 kg)   SpO2 97%   BMI 25.38 kg/m  GEN: NAD EYE: Sclerae anicteric ENT: MMM CV: Non-tachycardic Pulm: CTA b/l GI: Soft, NT/ND NEURO:  Alert & Oriented x 3   Zenovia Jarred, MD [facility]  01/07/2021 10:46 AM

## 2021-01-07 NOTE — Op Note (Signed)
[name] Patient Name: Ricky Freeman Procedure Date: 01/07/2021 10:45 AM MRN: [id number] Endoscopist: Jerene Bears , MD Age: 60 Referring MD:  Date of Birth: 10-Jul-1960 Gender: Male Account #: [id number] Procedure:                Colonoscopy Indications:              High risk colon cancer surveillance: Personal                            history of multiple polyps (adenomas and sessile                            serrated polyp), Last colonoscopy: July 2017 Medicines:                Monitored Anesthesia Care Procedure:                Pre-Anesthesia Assessment:                           - Prior to the procedure, a History and Physical                            was performed, and patient medications and                            allergies were reviewed. The patient's tolerance of                            previous anesthesia was also reviewed. The risks                            and benefits of the procedure and the sedation                            options and risks were discussed with the patient.                            All questions were answered, and informed consent                            was obtained. Prior Anticoagulants: The patient has                            taken no previous anticoagulant or antiplatelet                            agents. ASA Grade Assessment: I - A normal, healthy                            patient. After reviewing the risks and benefits,                            the patient was deemed in satisfactory condition to  undergo the procedure.                           After obtaining informed consent, the colonoscope                            was passed under direct vision. Throughout the                            procedure, the patient's blood pressure, pulse, and                            oxygen saturations were monitored continuously. The                            CF [id number] #[id number] was introduced  through the anus                            and advanced to the cecum, identified by                            appendiceal orifice and ileocecal valve. The                            colonoscopy was performed without difficulty. The                            patient tolerated the procedure well. The quality                            of the bowel preparation was excellent. The                            ileocecal valve, appendiceal orifice, and rectum                            were photographed. Scope In: 10:55:37 AM Scope Out: 11:10:18 AM Scope Withdrawal Time: 0 hours 11 minutes 44 seconds  Total Procedure Duration: 0 hours 14 minutes 41 seconds  Findings:                 The digital rectal exam was normal.                           A 5 mm polyp was found in the splenic flexure. The                            polyp was sessile. The polyp was removed with a                            cold snare. Resection and retrieval were complete.                           Internal hemorrhoids were found during  retroflexion. The hemorrhoids were small.                           The exam was otherwise without abnormality. Complications:            No immediate complications. Estimated Blood Loss:     Estimated blood loss was minimal. Impression:               - One 5 mm polyp at the splenic flexure, removed                            with a cold snare. Resected and retrieved.                           - Small internal hemorrhoids.                           - The examination was otherwise normal. Recommendation:           - Patient has a contact number available for                            emergencies. The signs and symptoms of potential                            delayed complications were discussed with the                            patient. Return to normal activities tomorrow.                            Written discharge instructions were provided to the                             patient.                           - Resume previous diet.                           - Continue present medications.                           - Await pathology results.                           - Repeat colonoscopy is recommended for                            surveillance. The colonoscopy date will be                            determined after pathology results from today's                            exam become available for review. Jerene Bears, MD 01/07/2021 11:13:06 AM This report has been signed electronically.

## 2021-01-07 NOTE — Patient Instructions (Signed)
YOU HAD AN ENDOSCOPIC PROCEDURE TODAY AT [facility]:   Refer to the procedure report that was given to you for any specific questions about what was found during the examination.  If the procedure report does not answer your questions, please call your gastroenterologist to clarify.  If you requested that your care partner not be given the details of your procedure findings, then the procedure report has been included in a sealed envelope for you to review at your convenience later.  **Handout given on polyps**  YOU SHOULD EXPECT: Some feelings of bloating in the abdomen. Passage of more gas than usual.  Walking can help get rid of the air that was put into your GI tract during the procedure and reduce the bloating. If you had a lower endoscopy (such as a colonoscopy or flexible sigmoidoscopy) you may notice spotting of blood in your stool or on the toilet paper. If you underwent a bowel prep for your procedure, you may not have a normal bowel movement for a few days.  Please Note:  You might notice some irritation and congestion in your nose or some drainage.  This is from the oxygen used during your procedure.  There is no need for concern and it should clear up in a day or so.  SYMPTOMS TO REPORT IMMEDIATELY:  Following lower endoscopy (colonoscopy or flexible sigmoidoscopy):  Excessive amounts of blood in the stool  Significant tenderness or worsening of abdominal pains  Swelling of the abdomen that is new, acute  Fever of 100F or higher  For urgent or emergent issues, a gastroenterologist can be reached at any hour by calling [phone]. Do not use MyChart messaging for urgent concerns.    DIET:  We do recommend a small meal at first, but then you may proceed to your regular diet.  Drink plenty of fluids but you should avoid alcoholic beverages for 24 hours.  ACTIVITY:  You should plan to take it easy for the rest of today and you should NOT DRIVE or use heavy  machinery until tomorrow (because of the sedation medicines used during the test).    FOLLOW UP: Our staff will call the number listed on your records 48-72 hours following your procedure to check on you and address any questions or concerns that you may have regarding the information given to you following your procedure. If we do not reach you, we will leave a message.  We will attempt to reach you two times.  During this call, we will ask if you have developed any symptoms of COVID 19. If you develop any symptoms (ie: fever, flu-like symptoms, shortness of breath, cough etc.) before then, please call [phone].  If you test positive for Covid 19 in the 2 weeks post procedure, please call and report this information to [place].    If any biopsies were taken you will be contacted by phone or by letter within the next 1-3 weeks.  Please call us at [phone] if you have not heard about the biopsies in 3 weeks.    SIGNATURES/CONFIDENTIALITY: You and/or your care partner have signed paperwork which will be entered into your electronic medical record.  These signatures attest to the fact that that the information above on your After Visit Summary has been reviewed and is understood.  Full responsibility of the confidentiality of this discharge information lies with you and/or your care-partner.

## 2021-01-07 NOTE — Progress Notes (Signed)
Vs by jd; previsit over the phone - no changes to health hx since previsit

## 2021-01-07 NOTE — Progress Notes (Signed)
Called to room to assist during endoscopic procedure.  Patient ID and intended procedure confirmed with present staff. Received instructions for my participation in the procedure from the performing physician.  

## 2021-01-09 ENCOUNTER — Telehealth: Payer: Self-pay

## 2021-01-09 NOTE — Telephone Encounter (Signed)
Left message on answering machine. 

## 2021-01-09 NOTE — Telephone Encounter (Signed)
NO ANSWER, MESSAGE LEFT FOR PATIENT. 

## 2021-01-15 ENCOUNTER — Encounter: Payer: Self-pay | Admitting: Internal Medicine

## 2021-04-02 ENCOUNTER — Other Ambulatory Visit: Payer: Self-pay | Admitting: Orthopedic Surgery

## 2021-04-02 DIAGNOSIS — M1611 Unilateral primary osteoarthritis, right hip: Secondary | ICD-10-CM

## 2021-04-10 ENCOUNTER — Other Ambulatory Visit: Payer: Self-pay

## 2021-04-10 ENCOUNTER — Ambulatory Visit
Admission: RE | Admit: 2021-04-10 | Discharge: 2021-04-10 | Disposition: A | Discharge status: Home / Self Care | Payer: 59 | Source: Ambulatory Visit | Attending: Orthopedic Surgery | Admitting: Orthopedic Surgery

## 2021-04-10 DIAGNOSIS — M1611 Unilateral primary osteoarthritis, right hip: Secondary | ICD-10-CM

## 2021-04-10 MED ORDER — IOPAMIDOL (ISOVUE-M 300) INJECTION 61%
1.0000 mL | Freq: Once | INTRAMUSCULAR | Status: AC
  Administered 2021-04-10: 1 mL via INTRA_ARTICULAR

## 2021-04-10 MED ORDER — METHYLPREDNISOLONE ACETATE 40 MG/ML INJ SUSP (RADIOLOG
80.0000 mg | Freq: Once | INTRAMUSCULAR | Status: AC
  Administered 2021-04-10: 80 mg via INTRA_ARTICULAR

## 2021-06-21 ENCOUNTER — Other Ambulatory Visit: Payer: Self-pay | Admitting: Family Medicine

## 2021-07-16 ENCOUNTER — Other Ambulatory Visit: Payer: Self-pay | Admitting: Orthopedic Surgery

## 2021-07-16 DIAGNOSIS — M1611 Unilateral primary osteoarthritis, right hip: Secondary | ICD-10-CM

## 2021-07-25 ENCOUNTER — Ambulatory Visit
Admission: RE | Admit: 2021-07-25 | Discharge: 2021-07-25 | Disposition: A | Discharge status: Home / Self Care | Payer: 59 | Source: Ambulatory Visit | Attending: Orthopedic Surgery | Admitting: Orthopedic Surgery

## 2021-07-25 DIAGNOSIS — M1611 Unilateral primary osteoarthritis, right hip: Secondary | ICD-10-CM

## 2021-07-25 MED ORDER — METHYLPREDNISOLONE ACETATE 40 MG/ML INJ SUSP (RADIOLOG
80.0000 mg | Freq: Once | INTRAMUSCULAR | Status: AC
  Administered 2021-07-25: 80 mg via INTRA_ARTICULAR

## 2021-07-25 MED ORDER — IOPAMIDOL (ISOVUE-M 200) INJECTION 41%
1.0000 mL | Freq: Once | INTRAMUSCULAR | Status: AC
  Administered 2021-07-25: 1 mL via INTRA_ARTICULAR

## 2021-09-02 ENCOUNTER — Other Ambulatory Visit: Payer: Self-pay | Admitting: Orthopedic Surgery

## 2021-09-11 ENCOUNTER — Other Ambulatory Visit: Payer: Self-pay | Admitting: Orthopedic Surgery

## 2021-09-11 DIAGNOSIS — M1611 Unilateral primary osteoarthritis, right hip: Secondary | ICD-10-CM

## 2021-09-26 ENCOUNTER — Ambulatory Visit (INDEPENDENT_AMBULATORY_CARE_PROVIDER_SITE_OTHER): Payer: 59 | Admitting: Family Medicine

## 2021-09-26 ENCOUNTER — Encounter: Payer: Self-pay | Admitting: Family Medicine

## 2021-09-26 VITALS — BP 118/78 | HR 58 | Temp 98.2°F | Ht 71.5 in | Wt 178.2 lb

## 2021-09-26 DIAGNOSIS — Z23 Encounter for immunization: Secondary | ICD-10-CM | POA: Diagnosis not present

## 2021-09-26 DIAGNOSIS — Z0000 Encounter for general adult medical examination without abnormal findings: Secondary | ICD-10-CM

## 2021-09-26 LAB — HEPATIC FUNCTION PANEL
ALT: 18 U/L (ref 0–53)
AST: 19 U/L (ref 0–37)
Albumin: 4.4 g/dL (ref 3.5–5.2)
Alkaline Phosphatase: 83 U/L (ref 39–117)
Bilirubin, Direct: 0.2 mg/dL (ref 0.0–0.3)
Total Bilirubin: 1.2 mg/dL (ref 0.2–1.2)
Total Protein: 6.7 g/dL (ref 6.0–8.3)

## 2021-09-26 LAB — CBC WITH DIFFERENTIAL/PLATELET
Basophils Absolute: 0.1 10*3/uL (ref 0.0–0.1)
Basophils Relative: 1.7 % (ref 0.0–3.0)
Eosinophils Absolute: 0.1 10*3/uL (ref 0.0–0.7)
Eosinophils Relative: 2 % (ref 0.0–5.0)
HCT: 41.5 % (ref 39.0–52.0)
Hemoglobin: 13.9 g/dL (ref 13.0–17.0)
Lymphocytes Relative: 35 % (ref 12.0–46.0)
Lymphs Abs: 1.9 10*3/uL (ref 0.7–4.0)
MCHC: 33.4 g/dL (ref 30.0–36.0)
MCV: 87 fl (ref 78.0–100.0)
Monocytes Absolute: 0.6 10*3/uL (ref 0.1–1.0)
Monocytes Relative: 11.5 % (ref 3.0–12.0)
Neutro Abs: 2.7 10*3/uL (ref 1.4–7.7)
Neutrophils Relative %: 49.8 % (ref 43.0–77.0)
Platelets: 272 10*3/uL (ref 150.0–400.0)
RBC: 4.77 Mil/uL (ref 4.22–5.81)
RDW: 13.5 % (ref 11.5–15.5)
WBC: 5.3 10*3/uL (ref 4.0–10.5)

## 2021-09-26 LAB — LIPID PANEL
Cholesterol: 164 mg/dL (ref 0–200)
HDL: 52.3 mg/dL (ref >39.00)
LDL Cholesterol: 94 mg/dL (ref 0–99)
NonHDL: 112.13
Total CHOL/HDL Ratio: 3
Triglycerides: 89 mg/dL (ref 0.0–149.0)
VLDL: 17.8 mg/dL (ref 0.0–40.0)

## 2021-09-26 LAB — BASIC METABOLIC PANEL
BUN: 16 mg/dL (ref 6–23)
CO2: 29 mEq/L (ref 19–32)
Calcium: 9.4 mg/dL (ref 8.4–10.5)
Chloride: 103 mEq/L (ref 96–112)
Creatinine, Ser: 1.19 mg/dL (ref 0.40–1.50)
GFR: 67.02 mL/min (ref >60.00)
Glucose, Bld: 92 mg/dL (ref 70–99)
Potassium: 4.9 mEq/L (ref 3.5–5.1)
Sodium: 138 mEq/L (ref 135–145)

## 2021-09-26 LAB — PSA: PSA: 3.6 ng/mL (ref 0.10–4.00)

## 2021-09-26 LAB — TSH: TSH: 1.94 u[IU]/mL (ref 0.35–5.50)

## 2021-09-26 LAB — HEMOGLOBIN A1C: Hgb A1c MFr Bld: 5.8 % (ref 4.6–6.5)

## 2021-09-26 MED ORDER — TRIAMCINOLONE ACETONIDE 0.1 % EX CREA: TOPICAL_CREAM | CUTANEOUS | 5 refills | Status: AC

## 2021-09-26 NOTE — Progress Notes (Signed)
   Subjective:    Patient ID: Ricky Freeman, male    DOB: 1961-01-29, 61 y.o.   MRN: [id number]  HPI Here for a well exam. He feels great except for right hip pain. He plans to have a replacement surgery per Dr. Delfino Lovett in January.    Review of Systems  Constitutional: Negative.   HENT: Negative.    Eyes: Negative.   Respiratory: Negative.    Cardiovascular: Negative.   Gastrointestinal: Negative.   Genitourinary: Negative.   Musculoskeletal:  Positive for arthralgias.  Skin: Negative.   Neurological: Negative.   Psychiatric/Behavioral: Negative.         Objective:   Physical Exam Constitutional:      General: He is not in acute distress.    Appearance: Normal appearance. He is well-developed. He is not diaphoretic.  HENT:     Head: Normocephalic and atraumatic.     Right Ear: External ear normal.     Left Ear: External ear normal.     Nose: Nose normal.     Mouth/Throat:     Pharynx: No oropharyngeal exudate.  Eyes:     General: No scleral icterus.       Right eye: No discharge.        Left eye: No discharge.     Conjunctiva/sclera: Conjunctivae normal.     Pupils: Pupils are equal, round, and reactive to light.  Neck:     Thyroid: No thyromegaly.     Vascular: No JVD.     Trachea: No tracheal deviation.  Cardiovascular:     Rate and Rhythm: Normal rate and regular rhythm.     Heart sounds: Normal heart sounds. No murmur heard.    No friction rub. No gallop.  Pulmonary:     Effort: Pulmonary effort is normal. No respiratory distress.     Breath sounds: Normal breath sounds. No wheezing or rales.  Chest:     Chest wall: No tenderness.  Abdominal:     General: Bowel sounds are normal. There is no distension.     Palpations: Abdomen is soft. There is no mass.     Tenderness: There is no abdominal tenderness. There is no guarding or rebound.  Genitourinary:    Penis: Normal. No tenderness.      Testes: Normal.     Prostate: Normal.     Rectum: Normal. Guaiac  result negative.  Musculoskeletal:        General: No tenderness. Normal range of motion.     Cervical back: Neck supple.  Lymphadenopathy:     Cervical: No cervical adenopathy.  Skin:    General: Skin is warm and dry.     Coloration: Skin is not pale.     Findings: No erythema or rash.  Neurological:     Mental Status: He is alert and oriented to person, place, and time.     Cranial Nerves: No cranial nerve deficit.     Motor: No abnormal muscle tone.     Coordination: Coordination normal.     Deep Tendon Reflexes: Reflexes are normal and symmetric. Reflexes normal.  Psychiatric:        Behavior: Behavior normal.        Thought Content: Thought content normal.        Judgment: Judgment normal.           Assessment & Plan:  Well exam. We discussed diet and exercise. Get fasting labs. Alysia Penna, MD

## 2021-09-30 ENCOUNTER — Ambulatory Visit
Admission: RE | Admit: 2021-09-30 | Discharge: 2021-09-30 | Disposition: A | Discharge status: Home / Self Care | Payer: 59 | Source: Ambulatory Visit | Attending: Orthopedic Surgery | Admitting: Orthopedic Surgery

## 2021-09-30 DIAGNOSIS — M1611 Unilateral primary osteoarthritis, right hip: Secondary | ICD-10-CM

## 2021-09-30 MED ORDER — METHYLPREDNISOLONE ACETATE 40 MG/ML INJ SUSP (RADIOLOG
120.0000 mg | Freq: Once | INTRAMUSCULAR | Status: AC
  Administered 2021-09-30: 120 mg via INTRA_ARTICULAR

## 2021-09-30 MED ORDER — IOPAMIDOL (ISOVUE-M 200) INJECTION 41%
1.0000 mL | Freq: Once | INTRAMUSCULAR | Status: AC
  Administered 2021-09-30: 1 mL via INTRA_ARTICULAR

## 2021-11-27 ENCOUNTER — Other Ambulatory Visit: Payer: Self-pay | Admitting: Orthopedic Surgery

## 2021-11-27 DIAGNOSIS — M1611 Unilateral primary osteoarthritis, right hip: Secondary | ICD-10-CM

## 2021-11-28 ENCOUNTER — Other Ambulatory Visit: Payer: Self-pay | Admitting: Family Medicine

## 2021-12-04 ENCOUNTER — Ambulatory Visit
Admission: RE | Admit: 2021-12-04 | Discharge: 2021-12-04 | Disposition: A | Discharge status: Home / Self Care | Payer: 59 | Source: Ambulatory Visit | Attending: Orthopedic Surgery | Admitting: Orthopedic Surgery

## 2021-12-04 DIAGNOSIS — M1611 Unilateral primary osteoarthritis, right hip: Secondary | ICD-10-CM

## 2021-12-04 MED ORDER — IOPAMIDOL (ISOVUE-M 200) INJECTION 41%
1.0000 mL | Freq: Once | INTRAMUSCULAR | Status: AC
  Administered 2021-12-04: 1 mL via INTRA_ARTICULAR

## 2021-12-04 MED ORDER — METHYLPREDNISOLONE ACETATE 40 MG/ML INJ SUSP (RADIOLOG
80.0000 mg | Freq: Once | INTRAMUSCULAR | Status: AC
  Administered 2021-12-04: 80 mg via INTRA_ARTICULAR

## 2021-12-13 ENCOUNTER — Telehealth: Payer: Self-pay | Admitting: Family Medicine

## 2021-12-13 NOTE — Telephone Encounter (Signed)
Pt preoperative form has been received and placed in Dr Sarajane Jews red folder for completing

## 2021-12-13 NOTE — Telephone Encounter (Signed)
Preoperative Risk Assessment to be filled out--placed in Dr's folder.  Fax to number on form upon completion.

## 2021-12-16 ENCOUNTER — Telehealth: Payer: Self-pay | Admitting: Family Medicine

## 2021-12-16 NOTE — Telephone Encounter (Signed)
Ricky Freeman  Fax: [phone]  Surgical clearance received, but they did not receive the last labs and office visit notes for July.  Please fax as soon as you can.

## 2021-12-17 NOTE — Telephone Encounter (Signed)
Pt office notes and last labs was faxed to [facility] as requested by Seth Bake

## 2021-12-20 NOTE — Telephone Encounter (Signed)
Pt surgical form and recent office notes was faxed 12/17/21. Confirmation received

## 2021-12-31 ENCOUNTER — Ambulatory Visit: Payer: Self-pay | Admitting: Student

## 2022-01-30 NOTE — Patient Instructions (Signed)
DUE TO COVID-19 ONLY TWO VISITORS  (aged 59 and older)  ARE ALLOWED TO COME WITH YOU AND STAY IN THE WAITING ROOM ONLY DURING PRE OP AND PROCEDURE.   **NO VISITORS ARE ALLOWED IN THE SHORT STAY AREA OR RECOVERY ROOM!!**  IF YOU WILL BE ADMITTED INTO THE HOSPITAL YOU ARE ALLOWED ONLY FOUR SUPPORT PEOPLE DURING VISITATION HOURS ONLY (7 AM -8PM)   The support person(s) must pass our screening, gel in and out, and wear a mask at all times, including in the patient's room. Patients must also wear a mask when staff or their support person are in the room. Visitors GUEST BADGE MUST BE WORN VISIBLY  One adult visitor may remain with you overnight and MUST be in the room by 8 P.M.     Your procedure is scheduled on: 02/13/22   Report to [facility] Main Entrance    Report to admitting at : 8:00 AM   Call this number if you have problems the morning of surgery [phone]   Do not eat food :After Midnight.   After Midnight you may have the following liquids until: 7:30 AM DAY OF SURGERY  Water Black Coffee (sugar ok, NO MILK/CREAM OR CREAMERS)  Tea (sugar ok, NO MILK/CREAM OR CREAMERS) regular and decaf                             Plain Jell-O (NO RED)                                           Fruit ices (not with fruit pulp, NO RED)                                     Popsicles (NO RED)                                                                  Juice: apple, WHITE grape, WHITE cranberry Sports drinks like Gatorade (NO RED)   The day of surgery:  Drink ONE (1) Pre-Surgery Clear Ensure or G2 at AM the morning of surgery. Drink in one sitting. Do not sip.  This drink was given to you during your hospital  pre-op appointment visit. Nothing else to drink after completing the  Pre-Surgery Clear Ensure or G2.          If you have questions, please contact your surgeon's office.    Oral Hygiene is also important to reduce your risk of infection.                                     Remember - BRUSH YOUR TEETH THE MORNING OF SURGERY WITH YOUR REGULAR TOOTHPASTE   Do NOT smoke after Midnight   Take these medicines the morning of surgery with A SIP OF WATER: Tylenol,lorazepam,rizatriptan as needed.  You may not have any metal on your body including hair pins, jewelry, and body piercing             Do not wear lotions, powders, perfumes/cologne, or deodorant              Men may shave face and neck.   Do not bring valuables to the hospital. [place].   Contacts, dentures or bridgework may not be worn into surgery.   Bring small overnight bag day of surgery.   DO NOT [place]. PHARMACY WILL DISPENSE MEDICATIONS LISTED ON YOUR MEDICATION LIST TO YOU DURING YOUR ADMISSION [place]!    Patients discharged on the day of surgery will not be allowed to drive home.  Someone NEEDS to stay with you for the first 24 hours after anesthesia.   Special Instructions: Bring a copy of your healthcare power of attorney and living will documents         the day of surgery if you haven't scanned them before.              Please read over the following fact sheets you were given: IF YOU HAVE QUESTIONS ABOUT YOUR PRE-OP INSTRUCTIONS PLEASE CALL [phone]    [facility] - Preparing for Surgery Before surgery, you can play an important role.  Because skin is not sterile, your skin needs to be as free of germs as possible.  You can reduce the number of germs on your skin by washing with CHG (chlorahexidine gluconate) soap before surgery.  CHG is an antiseptic cleaner which kills germs and bonds with the skin to continue killing germs even after washing. Please DO NOT use if you have an allergy to CHG or antibacterial soaps.  If your skin becomes reddened/irritated stop using the CHG and inform your nurse when you arrive at Short Stay. Do not shave (including legs and  underarms) for at least 48 hours prior to the first CHG shower.  You may shave your face/neck. Please follow these instructions carefully:  1.  Shower with CHG Soap the night before surgery and the  morning of Surgery.  2.  If you choose to wash your hair, wash your hair first as usual with your  normal  shampoo.  3.  After you shampoo, rinse your hair and body thoroughly to remove the  shampoo.                           4.  Use CHG as you would any other liquid soap.  You can apply chg directly  to the skin and wash                       Gently with a scrungie or clean washcloth.  5.  Apply the CHG Soap to your body ONLY FROM THE NECK DOWN.   Do not use on face/ open                           Wound or open sores. Avoid contact with eyes, ears mouth and genitals (private parts).                       Wash face,  Genitals (private parts) with your  normal soap.             6.  Wash thoroughly, paying special attention to the area where your surgery  will be performed.  7.  Thoroughly rinse your body with warm water from the neck down.  8.  DO NOT shower/wash with your normal soap after using and rinsing off  the CHG Soap.                9.  Pat yourself dry with a clean towel.            10.  Wear clean pajamas.            11.  Place clean sheets on your bed the night of your first shower and do not  sleep with pets. Day of Surgery : Do not apply any lotions/deodorants the morning of surgery.  Please wear clean clothes to the hospital/surgery center.  FAILURE TO FOLLOW THESE INSTRUCTIONS MAY RESULT IN THE CANCELLATION OF YOUR SURGERY PATIENT SIGNATURE_________________________________  NURSE SIGNATURE__________________________________  ________________________________________________________________________  Adam Phenix  An incentive spirometer is a tool that can help keep your lungs clear and active. This tool measures how well you are filling your lungs with each breath. Taking  long deep breaths may help reverse or decrease the chance of developing breathing (pulmonary) problems (especially infection) following: A long period of time when you are unable to move or be active. BEFORE THE PROCEDURE  If the spirometer includes an indicator to show your best effort, your nurse or respiratory therapist will set it to a desired goal. If possible, sit up straight or lean slightly forward. Try not to slouch. Hold the incentive spirometer in an upright position. INSTRUCTIONS FOR USE  Sit on the edge of your bed if possible, or sit up as far as you can in bed or on a chair. Hold the incentive spirometer in an upright position. Breathe out normally. Place the mouthpiece in your mouth and seal your lips tightly around it. Breathe in slowly and as deeply as possible, raising the piston or the ball toward the top of the column. Hold your breath for 3-5 seconds or for as long as possible. Allow the piston or ball to fall to the bottom of the column. Remove the mouthpiece from your mouth and breathe out normally. Rest for a few seconds and repeat Steps 1 through 7 at least 10 times every 1-2 hours when you are awake. Take your time and take a few normal breaths between deep breaths. The spirometer may include an indicator to show your best effort. Use the indicator as a goal to work toward during each repetition. After each set of 10 deep breaths, practice coughing to be sure your lungs are clear. If you have an incision (the cut made at the time of surgery), support your incision when coughing by placing a pillow or rolled up towels firmly against it. Once you are able to get out of bed, walk around indoors and cough well. You may stop using the incentive spirometer when instructed by your caregiver.  RISKS AND COMPLICATIONS Take your time so you do not get dizzy or light-headed. If you are in pain, you may need to take or ask for pain medication before doing incentive spirometry. It  is harder to take a deep breath if you are having pain. AFTER USE Rest and breathe slowly and easily. It can be helpful to keep track of a log of your progress. Your caregiver can  provide you with a simple table to help with this. If you are using the spirometer at home, follow these instructions: [place] IF:  You are having difficultly using the spirometer. You have trouble using the spirometer as often as instructed. Your pain medication is not giving enough relief while using the spirometer. You develop fever of 100.5 F (38.1 C) or higher. SEEK IMMEDIATE MEDICAL CARE IF:  You cough up bloody sputum that had not been present before. You develop fever of 102 F (38.9 C) or greater. You develop worsening pain at or near the incision site. MAKE SURE YOU:  Understand these instructions. Will watch your condition. Will get help right away if you are not doing well or get worse. Document Released: 07/14/2006 Document Revised: 05/26/2011 Document Reviewed: 09/14/2006 [facility] Patient Information 2014 [place], [place].   ________________________________________________________________________

## 2022-01-31 ENCOUNTER — Other Ambulatory Visit: Payer: Self-pay

## 2022-01-31 ENCOUNTER — Encounter (HOSPITAL_COMMUNITY)
Admission: RE | Admit: 2022-01-31 | Discharge: 2022-01-31 | Disposition: A | Discharge status: Home / Self Care | Payer: 59 | Source: Ambulatory Visit | Attending: Orthopedic Surgery | Admitting: Orthopedic Surgery

## 2022-01-31 ENCOUNTER — Encounter (HOSPITAL_COMMUNITY): Payer: Self-pay

## 2022-01-31 VITALS — BP 124/74 | HR 51 | Temp 97.8°F | Ht 72.0 in | Wt 186.0 lb

## 2022-01-31 DIAGNOSIS — I447 Left bundle-branch block, unspecified: Secondary | ICD-10-CM

## 2022-01-31 DIAGNOSIS — Z01818 Encounter for other preprocedural examination: Secondary | ICD-10-CM | POA: Diagnosis present

## 2022-01-31 LAB — SURGICAL PCR SCREEN
MRSA, PCR: NEGATIVE
Staphylococcus aureus: NEGATIVE

## 2022-01-31 LAB — CBC
HCT: 41.7 % (ref 39.0–52.0)
Hemoglobin: 13.6 g/dL (ref 13.0–17.0)
MCH: 27.6 pg (ref 26.0–34.0)
MCHC: 32.6 g/dL (ref 30.0–36.0)
MCV: 84.8 fL (ref 80.0–100.0)
Platelets: 283 10*3/uL (ref 150–400)
RBC: 4.92 MIL/uL (ref 4.22–5.81)
RDW: 14.1 % (ref 11.5–15.5)
WBC: 4.6 10*3/uL (ref 4.0–10.5)
nRBC: 0 % (ref 0.0–0.2)

## 2022-01-31 LAB — BASIC METABOLIC PANEL
Anion gap: 6 (ref 5–15)
BUN: 22 mg/dL — ABNORMAL HIGH (ref 6–20)
CO2: 27 mmol/L (ref 22–32)
Calcium: 9.3 mg/dL (ref 8.9–10.3)
Chloride: 105 mmol/L (ref 98–111)
Creatinine, Ser: 1.29 mg/dL — ABNORMAL HIGH (ref 0.61–1.24)
GFR, Estimated: >60 mL/min (ref >60)
Glucose, Bld: 103 mg/dL — ABNORMAL HIGH (ref 70–99)
Potassium: 5.1 mmol/L (ref 3.5–5.1)
Sodium: 138 mmol/L (ref 135–145)

## 2022-01-31 LAB — TYPE AND SCREEN
ABO/RH(D): A POS
Antibody Screen: NEGATIVE

## 2022-01-31 NOTE — Progress Notes (Signed)
For Short Stay: [place] appointment date:  Bowel Prep reminder:   For Anesthesia: PCP - Dr. Alysia Penna. LOV: 09/2021. Cardiologist - N/A  Chest x-ray -  EKG -  Stress Test -  ECHO - 08/18/2013 Cardiac Cath -  Pacemaker/ICD device last checked: Pacemaker orders received: Device Rep notified:  Spinal Cord Stimulator:  Sleep Study -  CPAP -   Fasting Blood Sugar -  Checks Blood Sugar _____ times a day Date and result of last Hgb A1c-  Last dose of GLP1 agonist-  GLP1 instructions:   Last dose of SGLT-2 inhibitors-  SGLT-2 instructions:   Blood Thinner Instructions: Aspirin Instructions: Last Dose:  Activity level: Can go up a flight of stairs and activities of daily living without stopping and without chest pain and/or shortness of breath   Able to exercise without chest pain and/or shortness of breath   Unable to go up a flight of stairs without chest pain and/or shortness of breath     Anesthesia review: Left BBB.  Patient denies shortness of breath, fever, cough and chest pain at PAT appointment   Patient verbalized understanding of instructions that were given to them at the PAT appointment. Patient was also instructed that they will need to review over the PAT instructions again at home before surgery.

## 2022-02-04 NOTE — Anesthesia Preprocedure Evaluation (Addendum)
Anesthesia Evaluation  Patient identified by MRN, date of birth, ID band Patient awake    Reviewed: Allergy & Precautions, NPO status , Patient's Chart, lab work & pertinent test results  History of Anesthesia Complications Negative for: history of anesthetic complications  Airway Mallampati: II  TM Distance: >3 FB Neck ROM: Full    Dental  (+) Dental Advisory Given   Pulmonary neg pulmonary ROS   breath sounds clear to auscultation       Cardiovascular negative cardio ROS  Rhythm:Regular Rate:Normal  LBBB: ECHO '15 EF 50-55%            '15 Coronary CTA: normal coronaries   Neuro/Psych negative neurological ROS     GI/Hepatic negative GI ROS, Neg liver ROS,,,  Endo/Other  negative endocrine ROS    Renal/GU negative Renal ROS     Musculoskeletal  (+) Arthritis ,    Abdominal   Peds  Hematology negative hematology ROS (+)   Anesthesia Other Findings   Reproductive/Obstetrics                             Anesthesia Physical Anesthesia Plan  ASA: 2  Anesthesia Plan: Spinal   Post-op Pain Management: Tylenol PO (pre-op)*   Induction: Intravenous  PONV Risk Score and Plan: 1 and Treatment may vary due to age or medical condition and Ondansetron  Airway Management Planned: Natural Airway and Simple Face Mask  Additional Equipment:   Intra-op Plan:   Post-operative Plan:   Informed Consent: I have reviewed the patients History and Physical, chart, labs and discussed the procedure including the risks, benefits and alternatives for the proposed anesthesia with the patient or authorized representative who has indicated his/her understanding and acceptance.     Dental advisory given  Plan Discussed with: CRNA and Surgeon  Anesthesia Plan Comments: (See PAT note 01/31/2022)       Anesthesia Quick Evaluation

## 2022-02-04 NOTE — Progress Notes (Signed)
Anesthesia Chart Review   Case: [id number] Date/Time: 02/13/22 1022   Procedure: TOTAL HIP ARTHROPLASTY ANTERIOR APPROACH (Right: Hip) - 150   Anesthesia type: Spinal   Pre-op diagnosis: Right hip osteoarthritis   Location: [facility] ROOM 08 / [facility]   Surgeons: Rod Can, MD       DISCUSSION:61 y.o. never smoker with h/o chronic LBBB, right hip OA scheduled for above procedure 02/13/2022 with Dr. Rod Can.   Per Dr. Radford Pax LBBB chronic, coronary CTA in 2015 with calcium score of 0 and normal coronary anatomy.  Echo with EF 50-55%.   Clearance received from PCP which states pt is optimzed medically and from cardiac standpoint.   Anticipate pt can proceed with planned procedure barring acute status change.   VS: BP 124/74   Pulse (!) 51   Temp 36.6 C (Oral)   Ht 6' (1.829 m)   Wt 84.4 kg   SpO2 100%   BMI 25.23 kg/m   PROVIDERS: Laurey Morale, MD is PCP    LABS: Labs reviewed: Acceptable for surgery. (all labs ordered are listed, but only abnormal results are displayed)  Labs Reviewed  BASIC METABOLIC PANEL - Abnormal; Notable for the following components:      Result Value   Glucose, Bld 103 (*)    BUN 22 (*)    Creatinine, Ser 1.29 (*)    All other components within normal limits  SURGICAL PCR SCREEN  CBC  TYPE AND SCREEN     IMAGES:   EKG:   CV: Echo 08/18/2013 Study Conclusions   - Left ventricle: The cavity size was normal. Wall thickness was    normal. Systolic function was normal. The estimated ejection    fraction was in the range of 50% to 55%. Wall motion was normal;    there were no regional wall motion abnormalities.  - Left atrium: The atrium was mildly dilated.  Past Medical History:  Diagnosis Date   Arthritis    "joints" (03/26/2017)   Basal cell carcinoma 2000s   "burned off my chest"   Impingement syndrome of left shoulder 11/2015   Left bundle branch block (LBBB) 2015   Coronary CTA with calcium score of 0 and normal coronary  anatomy   Rotator cuff strain 11/2015   left    Past Surgical History:  Procedure Laterality Date   COLONOSCOPY WITH PROPOFOL  01/07/2021   per Dr. Hilarie Fredrickson, adenomatous polyps, repeat in 7 yrs   JOINT REPLACEMENT     LASIK Bilateral    SHOULDER ARTHROSCOPY WITH ROTATOR CUFF REPAIR Left 11/2015   SHOULDER OPEN ROTATOR CUFF REPAIR Left 1992   Dr. Sheldon Left 03/26/2017   Procedure: TOTAL SHOULDER ARTHROPLASTY;  Surgeon: Justice Britain, MD;  Location: [place];  Service: Orthopedics;  Laterality: Left;   TOTAL SHOULDER ARTHROPLASTY Right 11/26/2017   Procedure: RIGHT TOTAL SHOULDER ARTHROPLASTY;  Surgeon: Justice Britain, MD;  Location: [place];  Service: Orthopedics;  Laterality: Right;   TOTAL SHOULDER REPLACEMENT Left 03/26/2017   VARICOCELECTOMY Bilateral 09/14/2001   WISDOM TOOTH EXTRACTION      MEDICATIONS:  acetaminophen (TYLENOL) 650 MG CR tablet   LORazepam (ATIVAN) 1 MG tablet   naproxen sodium (ALEVE) 220 MG tablet   rizatriptan (MAXALT) 10 MG tablet   triamcinolone cream (KENALOG) 0.1 %   TURMERIC PO   No current facility-administered medications for this encounter.    Ricky Felix Ward, PA-C [facility]Surgical Testing [phone])  [phone]      

## 2022-02-11 ENCOUNTER — Ambulatory Visit: Payer: Self-pay | Admitting: Student

## 2022-02-11 NOTE — H&P (Signed)
TOTAL HIP ADMISSION H&P  Patient is admitted for right total hip arthroplasty.  Subjective:  Chief Complaint: right hip pain  HPI: Ricky Freeman, 61 y.o. male, has a history of pain and functional disability in the right hip(s) due to arthritis and patient has failed non-surgical conservative treatments for greater than 12 weeks to include NSAID's and/or analgesics, corticosteriod injections, flexibility and strengthening excercises, and activity modification.  Onset of symptoms was gradual starting 6 years ago with rapidlly worsening course since that time.The patient noted no past surgery on the right hip(s).  Patient currently rates pain in the right hip at 10 out of 10 with activity. Patient has night pain, worsening of pain with activity and weight bearing, trendelenberg gait, pain that interfers with activities of daily living, and pain with passive range of motion. Patient has evidence of subchondral cysts, subchondral sclerosis, periarticular osteophytes, and joint space narrowing by imaging studies. This condition presents safety issues increasing the risk of falls.  There is no current active infection.  Patient Active Problem List   Diagnosis Date Noted   S/P shoulder replacement 11/26/2017   Status post total shoulder arthroplasty, left 03/26/2017   LBBB (left bundle branch block) 08/03/2013   Abnormal EKG 08/03/2013   DEPRESSION 05/30/2009   BENIGN PROSTATIC HYPERTROPHY, WITH OBSTRUCTION 05/30/2009   LOW BACK PAIN 04/26/2008   INSOMNIA, CHRONIC 01/12/2007   CYST, SEBACEOUS 01/12/2007   SHIN SPLINTS 01/12/2007   LUMBAR STRAIN 01/12/2007   VENEREAL WART 10/13/2006   SPRAIN/STRAIN, ANKLE NEC 10/13/2006   Past Medical History:  Diagnosis Date   Arthritis    "joints" (03/26/2017)   Basal cell carcinoma 2000s   "burned off my chest"   Impingement syndrome of left shoulder 11/2015   Left bundle branch block (LBBB) 2015   Coronary CTA with calcium score of 0 and normal coronary  anatomy   Rotator cuff strain 11/2015   left    Past Surgical History:  Procedure Laterality Date   COLONOSCOPY WITH PROPOFOL  01/07/2021   per Dr. Hilarie Fredrickson, adenomatous polyps, repeat in 7 yrs   JOINT REPLACEMENT     LASIK Bilateral    SHOULDER ARTHROSCOPY WITH ROTATOR CUFF REPAIR Left 11/2015   SHOULDER OPEN ROTATOR CUFF REPAIR Left 1992   Dr. Glasgow Left 03/26/2017   Procedure: TOTAL SHOULDER ARTHROPLASTY;  Surgeon: Justice Britain, MD;  Location: [place];  Service: Orthopedics;  Laterality: Left;   TOTAL SHOULDER ARTHROPLASTY Right 11/26/2017   Procedure: RIGHT TOTAL SHOULDER ARTHROPLASTY;  Surgeon: Justice Britain, MD;  Location: [place];  Service: Orthopedics;  Laterality: Right;   TOTAL SHOULDER REPLACEMENT Left 03/26/2017   VARICOCELECTOMY Bilateral 09/14/2001   WISDOM TOOTH EXTRACTION      Current Outpatient Medications  Medication Sig Dispense Refill Last Dose   acetaminophen (TYLENOL) 650 MG CR tablet Take 1,300 mg by mouth every 8 (eight) hours as needed for pain.      LORazepam (ATIVAN) 1 MG tablet TAKE 1 TABLET BY MOUTH EVERY 6 HOURS AS NEEDED (Patient taking differently: Take 1 mg by mouth daily as needed for anxiety.) 60 tablet 5    naproxen sodium (ALEVE) 220 MG tablet Take 220 mg by mouth daily as needed (pain).      rizatriptan (MAXALT) 10 MG tablet TAKE 1 TABLET AS DIRECTED, MAY REPEAT IN 2 HOURS IF NEEDED 10 tablet 3    triamcinolone cream (KENALOG) 0.1 % APPLY TO AFFECTED AREA TWICE A DAY  AS NEEDED FOR POISON IVY (Patient not taking: Reported on 01/29/2022) 45 g 5    TURMERIC PO Take 1 capsule by mouth daily.      No current facility-administered medications for this visit.   No Known Allergies  Social History   Tobacco Use   Smoking status: Never   Smokeless tobacco: Never  Substance Use Topics   Alcohol use: No    Family History  Problem Relation Age of Onset   Colon cancer Mother 38       stage I colon ca with  part colectomy   Prostate cancer Father    Esophageal cancer Neg Hx    Stomach cancer Neg Hx      Review of Systems  Musculoskeletal:  Positive for arthralgias and gait problem.  All other systems reviewed and are negative.   Objective:  Physical Exam Constitutional:      Appearance: Normal appearance.  HENT:     Head: Normocephalic and atraumatic.     Nose: Nose normal.     Mouth/Throat:     Mouth: Mucous membranes are moist.     Pharynx: Oropharynx is clear.  Eyes:     Conjunctiva/sclera: Conjunctivae normal.  Cardiovascular:     Rate and Rhythm: Normal rate and regular rhythm.     Pulses: Normal pulses.     Heart sounds: Normal heart sounds.  Pulmonary:     Effort: Pulmonary effort is normal.     Breath sounds: Normal breath sounds.  Abdominal:     General: Abdomen is flat.     Palpations: Abdomen is soft.  Genitourinary:    Comments: deferred Musculoskeletal:     Cervical back: Normal range of motion and neck supple.     Comments: Examination of the right hip reveals no skin wounds or lesion. He has severely restricted range of motion of the right hip. Severe pain with terminal rotation. No pain with palpation over the greater trochanter. Abductor strength 5/5  Neurovascularly intact distally.   Skin:    General: Skin is warm and dry.     Capillary Refill: Capillary refill takes less than 2 seconds.  Neurological:     General: No focal deficit present.     Mental Status: He is alert and oriented to person, place, and time.  Psychiatric:        Mood and Affect: Mood normal.        Behavior: Behavior normal.        Thought Content: Thought content normal.        Judgment: Judgment normal.     Vital signs in last 24 hours: '@VSRANGES'$ @  Labs:   Estimated body mass index is 25.23 kg/m as calculated from the following:   Height as of 01/31/22: 6' (1.829 m).   Weight as of 01/31/22: 84.4 kg.   Imaging Review Plain radiographs demonstrate severe  degenerative joint disease of the right hip(s). The bone quality appears to be adequate for age and reported activity level.      Assessment/Plan:  End stage arthritis, right hip(s)  The patient history, physical examination, clinical judgement of the provider and imaging studies are consistent with end stage degenerative joint disease of the right hip(s) and total hip arthroplasty is deemed medically necessary. The treatment options including medical management, injection therapy, arthroscopy and arthroplasty were discussed at length. The risks and benefits of total hip arthroplasty were presented and reviewed. The risks due to aseptic loosening, infection, stiffness, dislocation/subluxation,  thromboembolic complications and other  imponderables were discussed.  The patient acknowledged the explanation, agreed to proceed with the plan and consent was signed. Patient is being admitted for inpatient treatment for surgery, pain control, PT, OT, prophylactic antibiotics, VTE prophylaxis, progressive ambulation and ADL's and discharge planning.The patient is planning to be discharged home with HEP.   Therapy Plans: HEP.  Disposition: Home withTeri Williams. Fiancee Planned DVT Prophylaxis: aspirin '81mg'$  BID DME needed: Has rolling walker.  PCP: Cleared TXA: IV Allergies: NDKA Anesthesia Concerns: None.  BMI: 25.6 Last HgbA1c: 5.8 Other: - LBBB - Hydrocodone, zofran, methocarbamol.  - Cr 1.19, Hgb 13.9, K+ 4.9 - 01/31/22 Cr 1.29. NO NSAIDs   Patient's anticipated LOS is less than 2 midnights, meeting these requirements: - Younger than 60 - Lives within 1 hour of care - Has a competent adult at home to recover with post-op recover - NO history of  - Chronic pain requiring opiods  - Diabetes  - Coronary Artery Disease  - Heart failure  - Heart attack  - Stroke  - DVT/VTE  - Cardiac arrhythmia  - Respiratory Failure/COPD  - Renal failure  - Anemia  - Advanced Liver disease

## 2022-02-11 NOTE — H&P (View-Only) (Signed)
TOTAL HIP ADMISSION H&P  Patient is admitted for right total hip arthroplasty.  Subjective:  Chief Complaint: right hip pain  HPI: Ricky Freeman, 61 y.o. male, has a history of pain and functional disability in the right hip(s) due to arthritis and patient has failed non-surgical conservative treatments for greater than 12 weeks to include NSAID's and/or analgesics, corticosteriod injections, flexibility and strengthening excercises, and activity modification.  Onset of symptoms was gradual starting 6 years ago with rapidlly worsening course since that time.The patient noted no past surgery on the right hip(s).  Patient currently rates pain in the right hip at 10 out of 10 with activity. Patient has night pain, worsening of pain with activity and weight bearing, trendelenberg gait, pain that interfers with activities of daily living, and pain with passive range of motion. Patient has evidence of subchondral cysts, subchondral sclerosis, periarticular osteophytes, and joint space narrowing by imaging studies. This condition presents safety issues increasing the risk of falls.  There is no current active infection.  Patient Active Problem List   Diagnosis Date Noted   S/P shoulder replacement 11/26/2017   Status post total shoulder arthroplasty, left 03/26/2017   LBBB (left bundle branch block) 08/03/2013   Abnormal EKG 08/03/2013   DEPRESSION 05/30/2009   BENIGN PROSTATIC HYPERTROPHY, WITH OBSTRUCTION 05/30/2009   LOW BACK PAIN 04/26/2008   INSOMNIA, CHRONIC 01/12/2007   CYST, SEBACEOUS 01/12/2007   SHIN SPLINTS 01/12/2007   LUMBAR STRAIN 01/12/2007   VENEREAL WART 10/13/2006   SPRAIN/STRAIN, ANKLE NEC 10/13/2006   Past Medical History:  Diagnosis Date   Arthritis    "joints" (03/26/2017)   Basal cell carcinoma 2000s   "burned off my chest"   Impingement syndrome of left shoulder 11/2015   Left bundle branch block (LBBB) 2015   Coronary CTA with calcium score of 0 and normal coronary  anatomy   Rotator cuff strain 11/2015   left    Past Surgical History:  Procedure Laterality Date   COLONOSCOPY WITH PROPOFOL  01/07/2021   per Dr. Hilarie Fredrickson, adenomatous polyps, repeat in 7 yrs   JOINT REPLACEMENT     LASIK Bilateral    SHOULDER ARTHROSCOPY WITH ROTATOR CUFF REPAIR Left 11/2015   SHOULDER OPEN ROTATOR CUFF REPAIR Left 1992   Dr. Girard Left 03/26/2017   Procedure: TOTAL SHOULDER ARTHROPLASTY;  Surgeon: Justice Britain, MD;  Location: [facility];  Service: Orthopedics;  Laterality: Left;   TOTAL SHOULDER ARTHROPLASTY Right 11/26/2017   Procedure: RIGHT TOTAL SHOULDER ARTHROPLASTY;  Surgeon: Justice Britain, MD;  Location: [facility];  Service: Orthopedics;  Laterality: Right;   TOTAL SHOULDER REPLACEMENT Left 03/26/2017   VARICOCELECTOMY Bilateral 09/14/2001   WISDOM TOOTH EXTRACTION      Current Outpatient Medications  Medication Sig Dispense Refill Last Dose   acetaminophen (TYLENOL) 650 MG CR tablet Take 1,300 mg by mouth every 8 (eight) hours as needed for pain.      LORazepam (ATIVAN) 1 MG tablet TAKE 1 TABLET BY MOUTH EVERY 6 HOURS AS NEEDED (Patient taking differently: Take 1 mg by mouth daily as needed for anxiety.) 60 tablet 5    naproxen sodium (ALEVE) 220 MG tablet Take 220 mg by mouth daily as needed (pain).      rizatriptan (MAXALT) 10 MG tablet TAKE 1 TABLET AS DIRECTED, MAY REPEAT IN 2 HOURS IF NEEDED 10 tablet 3    triamcinolone cream (KENALOG) 0.1 % APPLY TO AFFECTED AREA TWICE A DAY  AS NEEDED FOR POISON IVY (Patient not taking: Reported on 01/29/2022) 45 g 5    TURMERIC PO Take 1 capsule by mouth daily.      No current facility-administered medications for this visit.   No Known Allergies  Social History   Tobacco Use   Smoking status: Never   Smokeless tobacco: Never  Substance Use Topics   Alcohol use: No    Family History  Problem Relation Age of Onset   Colon cancer Mother 64       stage I colon ca with  part colectomy   Prostate cancer Father    Esophageal cancer Neg Hx    Stomach cancer Neg Hx      Review of Systems  Musculoskeletal:  Positive for arthralgias and gait problem.  All other systems reviewed and are negative.   Objective:  Physical Exam Constitutional:      Appearance: Normal appearance.  HENT:     Head: Normocephalic and atraumatic.     Nose: Nose normal.     Mouth/Throat:     Mouth: Mucous membranes are moist.     Pharynx: Oropharynx is clear.  Eyes:     Conjunctiva/sclera: Conjunctivae normal.  Cardiovascular:     Rate and Rhythm: Normal rate and regular rhythm.     Pulses: Normal pulses.     Heart sounds: Normal heart sounds.  Pulmonary:     Effort: Pulmonary effort is normal.     Breath sounds: Normal breath sounds.  Abdominal:     General: Abdomen is flat.     Palpations: Abdomen is soft.  Genitourinary:    Comments: deferred Musculoskeletal:     Cervical back: Normal range of motion and neck supple.     Comments: Examination of the right hip reveals no skin wounds or lesion. He has severely restricted range of motion of the right hip. Severe pain with terminal rotation. No pain with palpation over the greater trochanter. Abductor strength 5/5  Neurovascularly intact distally.   Skin:    General: Skin is warm and dry.     Capillary Refill: Capillary refill takes less than 2 seconds.  Neurological:     General: No focal deficit present.     Mental Status: He is alert and oriented to person, place, and time.  Psychiatric:        Mood and Affect: Mood normal.        Behavior: Behavior normal.        Thought Content: Thought content normal.        Judgment: Judgment normal.     Vital signs in last 24 hours: '@VSRANGES'$ @  Labs:   Estimated body mass index is 25.23 kg/m as calculated from the following:   Height as of 01/31/22: 6' (1.829 m).   Weight as of 01/31/22: 84.4 kg.   Imaging Review Plain radiographs demonstrate severe  degenerative joint disease of the right hip(s). The bone quality appears to be adequate for age and reported activity level.      Assessment/Plan:  End stage arthritis, right hip(s)  The patient history, physical examination, clinical judgement of the provider and imaging studies are consistent with end stage degenerative joint disease of the right hip(s) and total hip arthroplasty is deemed medically necessary. The treatment options including medical management, injection therapy, arthroscopy and arthroplasty were discussed at length. The risks and benefits of total hip arthroplasty were presented and reviewed. The risks due to aseptic loosening, infection, stiffness, dislocation/subluxation,  thromboembolic complications and other  imponderables were discussed.  The patient acknowledged the explanation, agreed to proceed with the plan and consent was signed. Patient is being admitted for inpatient treatment for surgery, pain control, PT, OT, prophylactic antibiotics, VTE prophylaxis, progressive ambulation and ADL's and discharge planning.The patient is planning to be discharged home with HEP.   Therapy Plans: HEP.  Disposition: Home withTeri Williams. Fiancee Planned DVT Prophylaxis: aspirin '81mg'$  BID DME needed: Has rolling walker.  PCP: Cleared TXA: IV Allergies: NDKA Anesthesia Concerns: None.  BMI: 25.6 Last HgbA1c: 5.8 Other: - LBBB - Hydrocodone, zofran, methocarbamol.  - Cr 1.19, Hgb 13.9, K+ 4.9 - 01/31/22 Cr 1.29. NO NSAIDs   Patient's anticipated LOS is less than 2 midnights, meeting these requirements: - Younger than 1 - Lives within 1 hour of care - Has a competent adult at home to recover with post-op recover - NO history of  - Chronic pain requiring opiods  - Diabetes  - Coronary Artery Disease  - Heart failure  - Heart attack  - Stroke  - DVT/VTE  - Cardiac arrhythmia  - Respiratory Failure/COPD  - Renal failure  - Anemia  - Advanced Liver disease

## 2022-02-13 ENCOUNTER — Ambulatory Visit (HOSPITAL_COMMUNITY): Payer: 59 | Admitting: Physician Assistant

## 2022-02-13 ENCOUNTER — Ambulatory Visit (HOSPITAL_COMMUNITY): Payer: 59

## 2022-02-13 ENCOUNTER — Encounter (HOSPITAL_COMMUNITY): Payer: Self-pay | Admitting: Orthopedic Surgery

## 2022-02-13 ENCOUNTER — Ambulatory Visit (HOSPITAL_BASED_OUTPATIENT_CLINIC_OR_DEPARTMENT_OTHER): Payer: 59 | Admitting: Anesthesiology

## 2022-02-13 ENCOUNTER — Other Ambulatory Visit: Payer: Self-pay

## 2022-02-13 ENCOUNTER — Ambulatory Visit (HOSPITAL_COMMUNITY)
Admission: RE | Admit: 2022-02-13 | Discharge: 2022-02-13 | Disposition: A | Discharge status: Home / Self Care | Payer: 59 | Source: Ambulatory Visit | Attending: Orthopedic Surgery | Admitting: Orthopedic Surgery

## 2022-02-13 ENCOUNTER — Encounter (HOSPITAL_COMMUNITY)
Admission: RE | Disposition: A | Discharge status: Home / Self Care | Payer: Self-pay | Source: Ambulatory Visit | Attending: Orthopedic Surgery

## 2022-02-13 DIAGNOSIS — Z96611 Presence of right artificial shoulder joint: Secondary | ICD-10-CM | POA: Insufficient documentation

## 2022-02-13 DIAGNOSIS — I447 Left bundle-branch block, unspecified: Secondary | ICD-10-CM | POA: Insufficient documentation

## 2022-02-13 DIAGNOSIS — Z85828 Personal history of other malignant neoplasm of skin: Secondary | ICD-10-CM | POA: Diagnosis not present

## 2022-02-13 DIAGNOSIS — M47817 Spondylosis without myelopathy or radiculopathy, lumbosacral region: Secondary | ICD-10-CM | POA: Insufficient documentation

## 2022-02-13 DIAGNOSIS — M1611 Unilateral primary osteoarthritis, right hip: Secondary | ICD-10-CM

## 2022-02-13 HISTORY — PX: TOTAL HIP ARTHROPLASTY: SHX124

## 2022-02-13 LAB — ABO/RH: ABO/RH(D): A POS

## 2022-02-13 SURGERY — ARTHROPLASTY, HIP, TOTAL, ANTERIOR APPROACH
Anesthesia: Spinal | Site: Hip | Laterality: Right

## 2022-02-13 MED ORDER — CHLORHEXIDINE GLUCONATE 0.12 % MT SOLN
15.0000 mL | Freq: Once | OROMUCOSAL | Status: AC
  Administered 2022-02-13: 15 mL via OROMUCOSAL

## 2022-02-13 MED ORDER — KETOROLAC TROMETHAMINE 30 MG/ML IJ SOLN
INTRAMUSCULAR | Status: DC | PRN
  Administered 2022-02-13: 30 mg via INTRAMUSCULAR

## 2022-02-13 MED ORDER — ONDANSETRON HCL 4 MG/2ML IJ SOLN
INTRAMUSCULAR | Status: AC
  Filled 2022-02-13: qty 2

## 2022-02-13 MED ORDER — POVIDONE-IODINE 10 % EX SWAB
2.0000 | Freq: Once | CUTANEOUS | Status: AC
  Administered 2022-02-13: 2 via TOPICAL

## 2022-02-13 MED ORDER — PHENYLEPHRINE 80 MCG/ML (10ML) SYRINGE FOR IV PUSH (FOR BLOOD PRESSURE SUPPORT)
PREFILLED_SYRINGE | INTRAVENOUS | Status: AC
  Filled 2022-02-13: qty 40

## 2022-02-13 MED ORDER — MEPERIDINE HCL 50 MG/ML IJ SOLN: 6.2500 mg | INTRAMUSCULAR | Status: DC | PRN

## 2022-02-13 MED ORDER — PROMETHAZINE HCL 25 MG/ML IJ SOLN: 6.2500 mg | INTRAMUSCULAR | Status: DC | PRN

## 2022-02-13 MED ORDER — DEXAMETHASONE SODIUM PHOSPHATE 10 MG/ML IJ SOLN
INTRAMUSCULAR | Status: AC
  Filled 2022-02-13: qty 4

## 2022-02-13 MED ORDER — ORAL CARE MOUTH RINSE: 15.0000 mL | Freq: Once | OROMUCOSAL | Status: AC

## 2022-02-13 MED ORDER — MELOXICAM 15 MG PO TABS: 15.0000 mg | ORAL_TABLET | Freq: Every day | ORAL | 3 refills | Status: AC

## 2022-02-13 MED ORDER — SENNA 8.6 MG PO TABS: 2.0000 | ORAL_TABLET | Freq: Every day | ORAL | 1 refills | Status: AC

## 2022-02-13 MED ORDER — MORPHINE SULFATE (PF) 2 MG/ML IV SOLN: 0.5000 mg | INTRAVENOUS | Status: DC | PRN

## 2022-02-13 MED ORDER — BUPIVACAINE-EPINEPHRINE 0.25% -1:200000 IJ SOLN
INTRAMUSCULAR | Status: DC | PRN
  Administered 2022-02-13: 30 mL

## 2022-02-13 MED ORDER — METOCLOPRAMIDE HCL 5 MG PO TABS: 5.0000 mg | ORAL_TABLET | Freq: Three times a day (TID) | ORAL | Status: DC | PRN

## 2022-02-13 MED ORDER — LIDOCAINE HCL (CARDIAC) PF 100 MG/5ML IV SOSY: PREFILLED_SYRINGE | INTRAVENOUS | Status: DC | PRN

## 2022-02-13 MED ORDER — SODIUM CHLORIDE (PF) 0.9 % IJ SOLN
INTRAMUSCULAR | Status: AC
  Filled 2022-02-13: qty 30

## 2022-02-13 MED ORDER — PROPOFOL 500 MG/50ML IV EMUL
INTRAVENOUS | Status: DC | PRN
  Administered 2022-02-13: 75 ug/kg/min via INTRAVENOUS

## 2022-02-13 MED ORDER — ACETAMINOPHEN 325 MG PO TABS: 325.0000 mg | ORAL_TABLET | Freq: Four times a day (QID) | ORAL | Status: DC | PRN

## 2022-02-13 MED ORDER — PROPOFOL 10 MG/ML IV BOLUS
INTRAVENOUS | Status: DC | PRN
  Administered 2022-02-13 (×3): 10 mg via INTRAVENOUS

## 2022-02-13 MED ORDER — ONDANSETRON HCL 4 MG/2ML IJ SOLN
INTRAMUSCULAR | Status: AC
  Filled 2022-02-13: qty 6

## 2022-02-13 MED ORDER — ACETAMINOPHEN 500 MG PO TABS
1000.0000 mg | ORAL_TABLET | Freq: Once | ORAL | Status: AC
  Administered 2022-02-13: 1000 mg via ORAL
  Filled 2022-02-13: qty 2

## 2022-02-13 MED ORDER — HYDROCODONE-ACETAMINOPHEN 5-325 MG PO TABS
1.0000 | ORAL_TABLET | ORAL | Status: DC | PRN
  Administered 2022-02-13: 1 via ORAL

## 2022-02-13 MED ORDER — OXYCODONE HCL 5 MG PO TABS: 5.0000 mg | ORAL_TABLET | Freq: Once | ORAL | Status: DC | PRN

## 2022-02-13 MED ORDER — CEFAZOLIN SODIUM-DEXTROSE 2-4 GM/100ML-% IV SOLN: 2.0000 g | Freq: Four times a day (QID) | INTRAVENOUS | Status: DC

## 2022-02-13 MED ORDER — SUCCINYLCHOLINE CHLORIDE 200 MG/10ML IV SOSY
PREFILLED_SYRINGE | INTRAVENOUS | Status: AC
  Filled 2022-02-13: qty 20

## 2022-02-13 MED ORDER — BUPIVACAINE IN DEXTROSE 0.75-8.25 % IT SOLN
INTRATHECAL | Status: DC | PRN
  Administered 2022-02-13: 13.5 mg via INTRATHECAL

## 2022-02-13 MED ORDER — ISOPROPYL ALCOHOL 70 % SOLN
Status: DC | PRN
  Administered 2022-02-13: 1 via TOPICAL

## 2022-02-13 MED ORDER — HYDROCODONE-ACETAMINOPHEN 5-325 MG PO TABS
ORAL_TABLET | ORAL | Status: AC
  Filled 2022-02-13: qty 1

## 2022-02-13 MED ORDER — POLYETHYLENE GLYCOL 3350 17 G PO PACK: 17.0000 g | PACK | Freq: Every day | ORAL | 0 refills | Status: AC | PRN

## 2022-02-13 MED ORDER — POVIDONE-IODINE 10 % EX SWAB: 2.0000 | Freq: Once | CUTANEOUS | Status: DC

## 2022-02-13 MED ORDER — LACTATED RINGERS IV BOLUS
500.0000 mL | Freq: Once | INTRAVENOUS | Status: AC
  Administered 2022-02-13: 500 mL via INTRAVENOUS

## 2022-02-13 MED ORDER — LACTATED RINGERS IV BOLUS
250.0000 mL | Freq: Once | INTRAVENOUS | Status: AC
  Administered 2022-02-13: 250 mL via INTRAVENOUS

## 2022-02-13 MED ORDER — KETOROLAC TROMETHAMINE 15 MG/ML IJ SOLN: 15.0000 mg | Freq: Four times a day (QID) | INTRAMUSCULAR | Status: DC

## 2022-02-13 MED ORDER — KETOROLAC TROMETHAMINE 30 MG/ML IJ SOLN
INTRAMUSCULAR | Status: AC
  Filled 2022-02-13: qty 1

## 2022-02-13 MED ORDER — EPHEDRINE 5 MG/ML INJ
INTRAVENOUS | Status: AC
  Filled 2022-02-13: qty 5

## 2022-02-13 MED ORDER — METHOCARBAMOL 500 MG PO TABS: 500.0000 mg | ORAL_TABLET | Freq: Four times a day (QID) | ORAL | Status: DC | PRN

## 2022-02-13 MED ORDER — LACTATED RINGERS IV SOLN: INTRAVENOUS | Status: DC

## 2022-02-13 MED ORDER — BUPIVACAINE-EPINEPHRINE (PF) 0.25% -1:200000 IJ SOLN
INTRAMUSCULAR | Status: AC
  Filled 2022-02-13: qty 30

## 2022-02-13 MED ORDER — ONDANSETRON HCL 4 MG/2ML IJ SOLN
INTRAMUSCULAR | Status: DC | PRN
  Administered 2022-02-13: 4 mg via INTRAVENOUS

## 2022-02-13 MED ORDER — MIDAZOLAM HCL 2 MG/2ML IJ SOLN: 0.5000 mg | Freq: Once | INTRAMUSCULAR | Status: DC | PRN

## 2022-02-13 MED ORDER — FENTANYL CITRATE (PF) 100 MCG/2ML IJ SOLN
INTRAMUSCULAR | Status: AC
  Filled 2022-02-13: qty 2

## 2022-02-13 MED ORDER — METHOCARBAMOL 500 MG IVPB - SIMPLE MED: 500.0000 mg | Freq: Four times a day (QID) | INTRAVENOUS | Status: DC | PRN

## 2022-02-13 MED ORDER — METOPROLOL TARTRATE 5 MG/5ML IV SOLN
INTRAVENOUS | Status: AC
  Filled 2022-02-13: qty 10

## 2022-02-13 MED ORDER — HYDROCODONE-ACETAMINOPHEN 7.5-325 MG PO TABS: 1.0000 | ORAL_TABLET | ORAL | Status: DC | PRN

## 2022-02-13 MED ORDER — MIDAZOLAM HCL 5 MG/5ML IJ SOLN
INTRAMUSCULAR | Status: DC | PRN
  Administered 2022-02-13: 2 mg via INTRAVENOUS

## 2022-02-13 MED ORDER — ONDANSETRON HCL 4 MG PO TABS: 4.0000 mg | ORAL_TABLET | Freq: Three times a day (TID) | ORAL | 0 refills | Status: AC | PRN

## 2022-02-13 MED ORDER — DOCUSATE SODIUM 100 MG PO CAPS: 100.0000 mg | ORAL_CAPSULE | Freq: Two times a day (BID) | ORAL | 1 refills | Status: AC

## 2022-02-13 MED ORDER — TRANEXAMIC ACID-NACL 1000-0.7 MG/100ML-% IV SOLN
1000.0000 mg | INTRAVENOUS | Status: AC
  Administered 2022-02-13: 1000 mg via INTRAVENOUS
  Filled 2022-02-13: qty 100

## 2022-02-13 MED ORDER — HYDROMORPHONE HCL 1 MG/ML IJ SOLN: 0.2500 mg | INTRAMUSCULAR | Status: DC | PRN

## 2022-02-13 MED ORDER — LIDOCAINE HCL (PF) 2 % IJ SOLN
INTRAMUSCULAR | Status: AC
  Filled 2022-02-13: qty 35

## 2022-02-13 MED ORDER — SODIUM CHLORIDE (PF) 0.9 % IJ SOLN
INTRAMUSCULAR | Status: DC | PRN
  Administered 2022-02-13: 30 mL

## 2022-02-13 MED ORDER — MIDAZOLAM HCL 2 MG/2ML IJ SOLN
INTRAMUSCULAR | Status: AC
  Filled 2022-02-13: qty 2

## 2022-02-13 MED ORDER — ASPIRIN 81 MG PO CHEW: 81.0000 mg | CHEWABLE_TABLET | Freq: Two times a day (BID) | ORAL | 0 refills | Status: AC

## 2022-02-13 MED ORDER — CEFAZOLIN SODIUM-DEXTROSE 2-4 GM/100ML-% IV SOLN
2.0000 g | INTRAVENOUS | Status: AC
  Administered 2022-02-13: 2 g via INTRAVENOUS
  Filled 2022-02-13: qty 100

## 2022-02-13 MED ORDER — SUGAMMADEX SODIUM 500 MG/5ML IV SOLN
INTRAVENOUS | Status: AC
  Filled 2022-02-13: qty 5

## 2022-02-13 MED ORDER — OXYCODONE HCL 5 MG/5ML PO SOLN: 5.0000 mg | Freq: Once | ORAL | Status: DC | PRN

## 2022-02-13 MED ORDER — HYDROCODONE-ACETAMINOPHEN 5-325 MG PO TABS: 1.0000 | ORAL_TABLET | ORAL | 0 refills | Status: AC | PRN

## 2022-02-13 MED ORDER — SODIUM CHLORIDE 0.9 % IR SOLN
Status: DC | PRN
  Administered 2022-02-13: 1000 mL
  Administered 2022-02-13: 3000 mL

## 2022-02-13 MED ORDER — ACETAMINOPHEN 500 MG PO TABS: 1000.0000 mg | ORAL_TABLET | Freq: Once | ORAL | Status: DC

## 2022-02-13 MED ORDER — DEXAMETHASONE SODIUM PHOSPHATE 10 MG/ML IJ SOLN
INTRAMUSCULAR | Status: AC
  Filled 2022-02-13: qty 1

## 2022-02-13 MED ORDER — DEXAMETHASONE SODIUM PHOSPHATE 4 MG/ML IJ SOLN
INTRAMUSCULAR | Status: DC | PRN
  Administered 2022-02-13: 5 mg via INTRAVENOUS

## 2022-02-13 MED ORDER — ROCURONIUM BROMIDE 10 MG/ML (PF) SYRINGE
PREFILLED_SYRINGE | INTRAVENOUS | Status: AC
  Filled 2022-02-13: qty 50

## 2022-02-13 MED ORDER — PROPOFOL 1000 MG/100ML IV EMUL
INTRAVENOUS | Status: AC
  Filled 2022-02-13: qty 100

## 2022-02-13 MED ORDER — ONDANSETRON HCL 4 MG/2ML IJ SOLN: 4.0000 mg | Freq: Four times a day (QID) | INTRAMUSCULAR | Status: DC | PRN

## 2022-02-13 MED ORDER — METOCLOPRAMIDE HCL 5 MG/ML IJ SOLN: 5.0000 mg | Freq: Three times a day (TID) | INTRAMUSCULAR | Status: DC | PRN

## 2022-02-13 MED ORDER — ONDANSETRON HCL 4 MG PO TABS
4.0000 mg | ORAL_TABLET | Freq: Four times a day (QID) | ORAL | Status: DC | PRN
  Filled 2022-02-13: qty 1

## 2022-02-13 SURGICAL SUPPLY — 56 items
ADH SKN CLS APL DERMABOND .7 (GAUZE/BANDAGES/DRESSINGS) ×1
APL PRP STRL LF DISP 70% ISPRP (MISCELLANEOUS) ×1
BAG COUNTER SPONGE SURGICOUNT (BAG) IMPLANT
BAG DECANTER FOR FLEXI CONT (MISCELLANEOUS) IMPLANT
BAG SPEC THK2 15X12 ZIP CLS (MISCELLANEOUS)
BAG SPNG CNTER NS LX DISP (BAG)
BAG ZIPLOCK 12X15 (MISCELLANEOUS) IMPLANT
CHLORAPREP W/TINT 26 (MISCELLANEOUS) ×1 IMPLANT
COVER PERINEAL POST (MISCELLANEOUS) ×1 IMPLANT
COVER SURGICAL LIGHT HANDLE (MISCELLANEOUS) ×1 IMPLANT
DERMABOND ADVANCED .7 DNX12 (GAUZE/BANDAGES/DRESSINGS) ×2 IMPLANT
DRAPE IMP U-DRAPE 54X76 (DRAPES) ×1 IMPLANT
DRAPE SHEET LG 3/4 BI-LAMINATE (DRAPES) ×3 IMPLANT
DRAPE STERI IOBAN 125X83 (DRAPES) ×1 IMPLANT
DRAPE U-SHAPE 47X51 STRL (DRAPES) ×2 IMPLANT
DRSG AQUACEL AG ADV 3.5X10 (GAUZE/BANDAGES/DRESSINGS) ×1 IMPLANT
ELECT REM PT RETURN 15FT ADLT (MISCELLANEOUS) ×1 IMPLANT
GAUZE SPONGE 4X4 12PLY STRL (GAUZE/BANDAGES/DRESSINGS) ×1 IMPLANT
GLOVE BIO SURGEON STRL SZ8.5 (GLOVE) ×2 IMPLANT
GLOVE BIOGEL M 7.0 STRL (GLOVE) ×1 IMPLANT
GLOVE BIOGEL PI IND STRL 7.5 (GLOVE) ×1 IMPLANT
GLOVE BIOGEL PI IND STRL 8.5 (GLOVE) ×1 IMPLANT
GOWN SPEC L3 XXLG W/TWL (GOWN DISPOSABLE) ×1 IMPLANT
GOWN STRL REUS W/ TWL XL LVL3 (GOWN DISPOSABLE) ×1 IMPLANT
GOWN STRL REUS W/TWL XL LVL3 (GOWN DISPOSABLE) ×1
HANDPIECE INTERPULSE COAX TIP (DISPOSABLE) ×1
HEAD CERAMIC BIOLOX 36 (Head) IMPLANT
HOLDER FOLEY CATH W/STRAP (MISCELLANEOUS) ×1 IMPLANT
HOOD PEEL AWAY T7 (MISCELLANEOUS) ×3 IMPLANT
KIT TURNOVER KIT A (KITS) IMPLANT
LINER NCONST LONG G 36 G7 +5 (Liner) IMPLANT
MANIFOLD NEPTUNE II (INSTRUMENTS) ×1 IMPLANT
MARKER SKIN DUAL TIP RULER LAB (MISCELLANEOUS) ×1 IMPLANT
NDL SAFETY ECLIP 18X1.5 (MISCELLANEOUS) ×1 IMPLANT
NDL SPNL 18GX3.5 QUINCKE PK (NEEDLE) ×1 IMPLANT
NEEDLE SPNL 18GX3.5 QUINCKE PK (NEEDLE) ×1 IMPLANT
PACK ANTERIOR HIP CUSTOM (KITS) ×1 IMPLANT
PENCIL SMOKE EVACUATOR (MISCELLANEOUS) IMPLANT
SAW OSC TIP CART 19.5X105X1.3 (SAW) ×1 IMPLANT
SEALER BIPOLAR AQUA 6.0 (INSTRUMENTS) ×1 IMPLANT
SET HNDPC FAN SPRY TIP SCT (DISPOSABLE) ×1 IMPLANT
SHELL ACET G7 4H 58 SZG HIP (Shell) IMPLANT
SOLUTION PRONTOSAN WOUND 350ML (IRRIGATION / IRRIGATOR) ×1 IMPLANT
SPIKE FLUID TRANSFER (MISCELLANEOUS) ×1 IMPLANT
SUT MNCRL AB 3-0 PS2 18 (SUTURE) ×1 IMPLANT
SUT MON AB 2-0 CT1 36 (SUTURE) ×1 IMPLANT
SUT STRATAFIX PDO 1 14 VIOLET (SUTURE) ×1
SUT STRATFX PDO 1 14 VIOLET (SUTURE) ×1
SUT VIC AB 2-0 CT1 27 (SUTURE)
SUT VIC AB 2-0 CT1 TAPERPNT 27 (SUTURE) IMPLANT
SUTURE STRATFX PDO 1 14 VIOLET (SUTURE) ×1 IMPLANT
SYR 3ML LL SCALE MARK (SYRINGE) ×1 IMPLANT
TAPERLOC MCRO PRMY FMRL 16X117 (Joint) IMPLANT
TRAY FOLEY MTR SLVR 16FR STAT (SET/KITS/TRAYS/PACK) IMPLANT
TUBE SUCTION HIGH CAP CLEAR NV (SUCTIONS) ×1 IMPLANT
WATER STERILE IRR 1000ML POUR (IV SOLUTION) ×1 IMPLANT

## 2022-02-13 NOTE — Transfer of Care (Signed)
Immediate Anesthesia Transfer of Care Note  Patient: Ricky Freeman  Procedure(s) Performed: Procedure(s) (LRB): TOTAL HIP ARTHROPLASTY ANTERIOR APPROACH (Right)  Patient Location: PACU  Anesthesia Type: SAB   Level of Consciousness: awake, sedated, patient cooperative and responds to stimulation  Airway & Oxygen Therapy: Patient Spontanous Breathing and Patient awake on RA   Post-op Assessment: Report given to PACU RN, Post -op Vital signs reviewed and stable and Patient moving all extremities  Post vital signs: Reviewed and stable  Complications: No apparent anesthesia complications

## 2022-02-13 NOTE — Interval H&P Note (Signed)
History and Physical Interval Note:  02/13/2022 9:42 AM  Ricky Freeman  has presented today for surgery, with the diagnosis of Right hip osteoarthritis.  The various methods of treatment have been discussed with the patient and family. After consideration of risks, benefits and other options for treatment, the patient has consented to  Procedure(s) with comments: [place] (Right) - 150 as a surgical intervention.  The patient's history has been reviewed, patient examined, no change in status, stable for surgery.  I have reviewed the patient's chart and labs.  Questions were answered to the patient's satisfaction.     Hilton Cork Carletha Dawn

## 2022-02-13 NOTE — Evaluation (Signed)
Physical Therapy Evaluation Patient Details Name: Ricky Freeman MRN: [id number] DOB: 09-May-1960 Today's Date: 02/13/2022  History of Present Illness  Pt s/p R THR and with hx of LBBB and Bil TSR  Clinical Impression  Pt s/p R THR and presents with decreased R LE strength/ROM and post op pain limiting functional mobility.  This date, pt performed HEP with written instruction provided, reviewed car transfers, ambulated in hall and negotiated stairs.  Pt progressing well with mobility and eager for dc home this date.     Recommendations for follow up therapy are one component of a multi-disciplinary discharge planning process, led by the attending physician.  Recommendations may be updated based on patient status, additional functional criteria and insurance authorization.  Follow Up Recommendations Follow physician's recommendations for discharge plan and follow up therapies      Assistance Recommended at Discharge Set up Supervision/Assistance  Patient can return home with the following  A little help with walking and/or transfers;A little help with bathing/dressing/bathroom;Assistance with cooking/housework;Assist for transportation;Help with stairs or ramp for entrance    Equipment Recommendations None recommended by PT  Recommendations for Other Services       Functional Status Assessment Patient has had a recent decline in their functional status and demonstrates the ability to make significant improvements in function in a reasonable and predictable amount of time.     Precautions / Restrictions Precautions Precautions: Fall Restrictions Weight Bearing Restrictions: No      Mobility  Bed Mobility Overal bed mobility: Needs Assistance Bed Mobility: Supine to Sit     Supine to sit: Supervision     General bed mobility comments: for safety only    Transfers Overall transfer level: Needs assistance Equipment used: Rolling walker (2 wheels) Transfers: Sit to/from  Stand Sit to Stand: Min guard, Supervision           General transfer comment: cues for LE management and use of UEs to self assist    Ambulation/Gait Ambulation/Gait assistance: Min guard, Supervision Gait Distance (Feet): 140 Feet Assistive device: Rolling walker (2 wheels) Gait Pattern/deviations: Step-to pattern, Decreased step length - right, Decreased step length - left, Shuffle, Trunk flexed Gait velocity: decr     General Gait Details: cues for posture, position from RW and initial sequence  Stairs            Wheelchair Mobility    Modified Rankin (Stroke Patients Only)       Balance Overall balance assessment: Mild deficits observed, not formally tested                                           Pertinent Vitals/Pain Pain Assessment Pain Assessment: 0-10 Pain Score: 3  Pain Location: R hip Pain Descriptors / Indicators: Aching, Sore Pain Intervention(s): Limited activity within patient's tolerance, Monitored during session, Premedicated before session, Ice applied    Home Living Family/patient expects to be discharged to:: Private residence Living Arrangements: Spouse/significant other Available Help at Discharge: Family;Available 24 hours/day Type of Home: House Home Access: Stairs to enter Entrance Stairs-Rails: None;Right;Left Entrance Stairs-Number of Steps: 4 with rail vs 3 without Alternate Level Stairs-Number of Steps: flight Home Layout: Two level;Able to live on main level with bedroom/bathroom Home Equipment: Rolling Walker (2 wheels);Cane - single point      Prior Function Prior Level of Function : Independent/Modified Independent  Hand Dominance        Extremity/Trunk Assessment   Upper Extremity Assessment Upper Extremity Assessment: Overall WFL for tasks assessed    Lower Extremity Assessment Lower Extremity Assessment: RLE deficits/detail RLE Deficits / Details: 2+/5 strength  at hip with AAROM at hip to 90 flex and 20 abd    Cervical / Trunk Assessment Cervical / Trunk Assessment: Normal  Communication   Communication: No difficulties  Cognition Arousal/Alertness: Awake/alert Behavior During Therapy: WFL for tasks assessed/performed Overall Cognitive Status: Within Functional Limits for tasks assessed                                          General Comments      Exercises Total Joint Exercises Ankle Circles/Pumps: AROM, Both, 15 reps, Supine Quad Sets: AROM, Both, 10 reps, Supine Heel Slides: AAROM, Right, 15 reps, Supine Hip ABduction/ADduction: AAROM, Right, 15 reps, Supine Long Arc Quad: AAROM, AROM, Right, 10 reps, Seated   Assessment/Plan    PT Assessment Patient needs continued PT services  PT Problem List Decreased strength;Decreased range of motion;Decreased activity tolerance;Decreased balance;Decreased mobility;Decreased knowledge of use of DME;Pain       PT Treatment Interventions DME instruction;Gait training;Stair training;Functional mobility training;Therapeutic activities;Therapeutic exercise;Patient/family education    PT Goals (Current goals can be found in the Care Plan section)  Acute Rehab PT Goals Patient Stated Goal: regain IND PT Goal Formulation: All assessment and education complete, DC therapy    Frequency Min 1X/week     Co-evaluation               AM-PAC PT "6 Clicks" Mobility  Outcome Measure Help needed turning from your back to your side while in a flat bed without using bedrails?: A Little Help needed moving from lying on your back to sitting on the side of a flat bed without using bedrails?: A Little Help needed moving to and from a bed to a chair (including a wheelchair)?: A Little Help needed standing up from a chair using your arms (e.g., wheelchair or bedside chair)?: A Little Help needed to walk in hospital room?: A Little Help needed climbing 3-5 steps with a railing? : A  Little 6 Click Score: 18    End of Session Equipment Utilized During Treatment: Gait belt Activity Tolerance: Patient tolerated treatment well Patient left: in chair;with call bell/phone within reach;with family/visitor present Nurse Communication: Mobility status PT Visit Diagnosis: Difficulty in walking, not elsewhere classified (R26.2)    Time: 5397-6734 PT Time Calculation (min) (ACUTE ONLY): 40 min   Charges:   PT Evaluation $PT Eval Low Complexity: 1 Low PT Treatments $Gait Training: 8-22 mins $Therapeutic Exercise: 8-22 mins        [place] Pager [phone] Office [phone]   Jimeka Balan 02/13/2022, 5:48 PM

## 2022-02-13 NOTE — Anesthesia Procedure Notes (Signed)
Spinal  Patient location during procedure: OR End time: 02/13/2022 11:05 AM Reason for block: surgical anesthesia Staffing Performed: anesthesiologist  Anesthesiologist: Annye Asa, MD Performed by: Annye Asa, MD Authorized by: Annye Asa, MD   Preanesthetic Checklist Completed: patient identified, IV checked, site marked, risks and benefits discussed, surgical consent, monitors and equipment checked, pre-op evaluation and timeout performed Spinal Block Patient position: sitting Prep: DuraPrep and site prepped and draped Patient monitoring: blood pressure, continuous pulse ox, cardiac monitor and heart rate Approach: midline Location: L3-4 Injection technique: single-shot Needle Needle type: Pencan and Introducer  Needle gauge: 24 G Needle length: 9 cm Assessment Events: CSF return Additional Notes Pt identified in Operating room.  Monitors applied. Working IV access confirmed. Sterile prep, drape lumbar spine.  1% lido local L 3,4.  #24ga Pencan into clear CSF L 3,4.  13.'5mg'$  0.75% Bupivacaine with dextrose injected with asp CSF beginning and end of injection.  Patient asymptomatic, VSS, no heme aspirated, tolerated well.  Jenita Seashore, MD

## 2022-02-13 NOTE — Discharge Instructions (Signed)
? ?Dr. Holmes Hays ?Joint Replacement Specialist ?[facility] ?[address] ?[place], [place] [address] ?[phone] ? ? ?TOTAL HIP REPLACEMENT POSTOPERATIVE DIRECTIONS ? ? ? ?Hip Rehabilitation, Guidelines Following Surgery  ? ?WEIGHT BEARING ?Weight bearing as tolerated with assist device (walker, cane, etc) as directed, use it as long as suggested by your surgeon or therapist, typically at least 4-6 weeks. ? ?The results of a hip operation are greatly improved after range of motion and muscle strengthening exercises. Follow all safety measures which are given to protect your hip. If any of these exercises cause increased pain or swelling in your joint, decrease the amount until you are comfortable again. Then slowly increase the exercises. Call your caregiver if you have problems or questions.  ? ?HOME CARE INSTRUCTIONS  ?Most of the following instructions are designed to prevent the dislocation of your new hip.  ?Remove items at home which could result in a fall. This includes throw rugs or furniture in walking pathways.  ?Continue medications as instructed at time of discharge. ?You may have some home medications which will be placed on hold until you complete the course of blood thinner medication. ?You may start showering once you are discharged home. Do not remove your dressing. ?Do not put on socks or shoes without following the instructions of your caregivers.   ?Sit on chairs with arms. Use the chair arms to help push yourself up when arising.  ?Arrange for the use of a toilet seat elevator so you are not sitting low.  ?Walk with walker as instructed.  ?You may resume a sexual relationship in one month or when given the OK by your caregiver.  ?Use walker as long as suggested by your caregivers.  ?You may put full weight on your legs and walk as much as is comfortable. ?Avoid periods of inactivity such as sitting longer than an hour when not asleep. This helps prevent blood  clots.  ?You may return to work once you are cleared by your surgeon.  ?Do not drive a car for 6 weeks or until released by your surgeon.  ?Do not drive while taking narcotics.  ?Wear elastic stockings for two weeks following surgery during the day but you may remove then at night.  ?Make sure you keep all of your appointments after your operation with all of your doctors and caregivers. You should call the office at the above phone number and make an appointment for approximately two weeks after the date of your surgery. ?Please pick up a stool softener and laxative for home use as long as you are requiring pain medications. ?ICE to the affected hip every three hours for 30 minutes at a time and then as needed for pain and swelling. Continue to use ice on the hip for pain and swelling from surgery. You may notice swelling that will progress down to the foot and ankle.  This is normal after surgery.  Elevate the leg when you are not up walking on it.   ?It is important for you to complete the blood thinner medication as prescribed by your doctor. ?Continue to use the breathing machine which will help keep your temperature down.  It is common for your temperature to cycle up and down following surgery, especially at night when you are not up moving around and exerting yourself.  The breathing machine keeps your lungs expanded and your temperature down. ? ?RANGE OF MOTION AND STRENGTHENING EXERCISES  ?These exercises are designed to help you   keep full movement of your hip joint. Follow your caregiver's or physical therapist's instructions. Perform all exercises about fifteen times, three times per day or as directed. Exercise both hips, even if you have had only one joint replacement. These exercises can be done on a training (exercise) mat, on the floor, on a table or on a bed. Use whatever works the best and is most comfortable for you. Use music or television while you are exercising so that the exercises are a  pleasant break in your day. This will make your life better with the exercises acting as a break in routine you can look forward to.  ?Lying on your back, slowly slide your foot toward your buttocks, raising your knee up off the floor. Then slowly slide your foot back down until your leg is straight again.  ?Lying on your back spread your legs as far apart as you can without causing discomfort.  ?Lying on your side, raise your upper leg and foot straight up from the floor as far as is comfortable. Slowly lower the leg and repeat.  ?Lying on your back, tighten up the muscle in the front of your thigh (quadriceps muscles). You can do this by keeping your leg straight and trying to raise your heel off the floor. This helps strengthen the largest muscle supporting your knee.  ?Lying on your back, tighten up the muscles of your buttocks both with the legs straight and with the knee bent at a comfortable angle while keeping your heel on the floor.  ? ?SKILLED REHAB INSTRUCTIONS: ?If the patient is transferred to a skilled rehab facility following release from the hospital, a list of the current medications will be sent to the facility for the patient to continue.  When discharged from the skilled rehab facility, please have the facility set up the patient's Home Health Physical Therapy prior to being released. Also, the skilled facility will be responsible for providing the patient with their medications at time of release from the facility to include their pain medication and their blood thinner medication. If the patient is still at the rehab facility at time of the two week follow up appointment, the skilled rehab facility will also need to assist the patient in arranging follow up appointment in our office and any transportation needs. ? ?POST-OPERATIVE OPIOID TAPER INSTRUCTIONS: ?It is important to wean off of your opioid medication as soon as possible. If you do not need pain medication after your surgery it is ok  to stop day one. ?Opioids include: ?Codeine, Hydrocodone(Norco, Vicodin), Oxycodone(Percocet, oxycontin) and hydromorphone amongst others.  ?Long term and even short term use of opiods can cause: ?Increased pain response ?Dependence ?Constipation ?Depression ?Respiratory depression ?And more.  ?Withdrawal symptoms can include ?Flu like symptoms ?Nausea, vomiting ?And more ?Techniques to manage these symptoms ?Hydrate well ?Eat regular healthy meals ?Stay active ?Use relaxation techniques(deep breathing, meditating, yoga) ?Do Not substitute Alcohol to help with tapering ?If you have been on opioids for less than two weeks and do not have pain than it is ok to stop all together.  ?Plan to wean off of opioids ?This plan should start within one week post op of your joint replacement. ?Maintain the same interval or time between taking each dose and first decrease the dose.  ?Cut the total daily intake of opioids by one tablet each day ?Next start to increase the time between doses. ?The last dose that should be eliminated is the evening dose.  ? ? ?MAKE   SURE YOU:  ?Understand these instructions.  ?Will watch your condition.  ?Will get help right away if you are not doing well or get worse. ? ?Pick up stool softner and laxative for home use following surgery while on pain medications. ?Do not remove your dressing. ?The dressing is waterproof--it is OK to take showers. ?Continue to use ice for pain and swelling after surgery. ?Do not use any lotions or creams on the incision until instructed by your surgeon. ?Total Hip Protocol. ? ?

## 2022-02-13 NOTE — Op Note (Signed)
OPERATIVE REPORT  SURGEON: Rod Can, MD   ASSISTANT: Larene Pickett, PA-C.  PREOPERATIVE DIAGNOSIS: Right hip arthritis.   POSTOPERATIVE DIAGNOSIS: Right hip arthritis.   PROCEDURE: Right total hip arthroplasty, anterior approach.   IMPLANTS: Biomet Taperloc Complete Microplasty stem, size 16 x 117m, high offset. Biomet G7 OsseoTi Cup, size 58 mm. Biomet Longevity liner, size 36 mm, G, +5 neutral. Biomet Biolox ceramic head ball, size 36 - 3 mm.  ANESTHESIA:  MAC, Regional, and Spinal  ESTIMATED BLOOD LOSS: 250 mL.    ANTIBIOTICS: 2g Ancef.  DRAINS: None.  COMPLICATIONS: None.   CONDITION: PACU - hemodynamically stable.   BRIEF CLINICAL NOTE: Ricky BUSKEYis a 61y.o. male with a long-standing history of Right hip arthritis. After failing conservative management, the patient was indicated for total hip arthroplasty. The risks, benefits, and alternatives to the procedure were explained, and the patient elected to proceed.  PROCEDURE IN DETAIL: Surgical site was marked by myself in the pre-op holding area. Once inside the operating room, spinal anesthesia was obtained, and a foley catheter was inserted. The patient was then positioned on the Hana table.  All bony prominences were well padded.  The hip was prepped and draped in the normal sterile surgical fashion.  A time-out was called verifying side and site of surgery. The patient received IV antibiotics within 60 minutes of beginning the procedure.   Bikini incision was made, and superficial dissection was performed lateral to the ASIS. The direct anterior approach to the hip was performed through the Hueter interval.  Lateral femoral circumflex vessels were treated with the Auqumantys. The anterior capsule was exposed and an inverted T capsulotomy was made. The femoral neck cut was made to the level of the templated cut.  A corkscrew was placed into the head and the head was removed.  The femoral head was found to have  eburnated bone. The head was passed to the back table and was measured. Pubofemoral ligament was released off of the calcar, taking care to stay on bone. Superior capsule was released from the greater trochanter, taking care to stay lateral to the posterior border of the femoral neck in order to preserve the short external rotators.   Acetabular exposure was achieved, and the pulvinar and labrum were excised. Sequential reaming of the acetabulum was then performed up to a size 57 mm reamer. A 58 mm cup was then opened and impacted into place at approximately 40 degrees of abduction and 20 degrees of anteversion. The final polyethylene liner was impacted into place and acetabular osteophytes were removed.    I then gained femoral exposure taking care to protect the abductors and greater trochanter.  This was performed using standard external rotation, extension, and adduction.  A cookie cutter was used to enter the femoral canal, and then the femoral canal finder was placed.  Sequential broaching was performed up to a size 16.  Calcar planer was used on the femoral neck remnant.  I placed a high offset neck and a trial head ball.  The hip was reduced.  Leg lengths and offset were checked fluoroscopically.  The hip was dislocated and trial components were removed.  The final implants were placed, and the hip was reduced.  Fluoroscopy was used to confirm component position and leg lengths.  At 90 degrees of external rotation and full extension, the hip was stable to an anterior directed force.   The wound was copiously irrigated with Prontosan solution and normal saline using pule  lavage.  Marcaine solution was injected into the periarticular soft tissue.  The wound was closed in layers using #1 Vicryl and V-Loc for the fascia, 2-0 Vicryl for the subcutaneous fat, 2-0 Monocryl for the deep dermal layer, 3-0 running Monocryl subcuticular stitch, and Dermabond for the skin.  Once the glue was fully dried, an  Aquacell Ag dressing was applied.  The patient was transported to the recovery room in stable condition.  Sponge, needle, and instrument counts were correct at the end of the case x2.  The patient tolerated the procedure well and there were no known complications.  Please note that a surgical assistant was a medical necessity for this procedure to perform it in a safe and expeditious manner. Assistant was necessary to provide appropriate retraction of vital neurovascular structures, to prevent femoral fracture, and to allow for anatomic placement of the prosthesis.

## 2022-02-13 NOTE — Anesthesia Procedure Notes (Signed)
Procedure Name: MAC Date/Time: 02/13/2022 10:59 AM  Performed by: Niel Hummer, CRNAPre-anesthesia Checklist: Patient identified, Emergency Drugs available, Suction available and Patient being monitored Oxygen Delivery Method: Simple face mask

## 2022-02-13 NOTE — Anesthesia Postprocedure Evaluation (Signed)
Anesthesia Post Note  Patient: Ricky Freeman  Procedure(s) Performed: TOTAL HIP ARTHROPLASTY ANTERIOR APPROACH (Right: Hip)     Patient location during evaluation: PACU Anesthesia Type: Spinal Level of consciousness: awake and alert, patient cooperative and oriented Pain management: pain level controlled Vital Signs Assessment: post-procedure vital signs reviewed and stable Respiratory status: nonlabored ventilation, spontaneous breathing and respiratory function stable Cardiovascular status: blood pressure returned to baseline and stable Postop Assessment: no apparent nausea or vomiting, patient able to bend at knees and spinal receding Anesthetic complications: no   No notable events documented.  Last Vitals:  Vitals:   02/13/22 1330 02/13/22 1345  BP: 120/79 137/77  Pulse: (!) 48 (!) 49  Resp: 14 12  Temp:    SpO2: 99% 100%    Last Pain:  Vitals:   02/13/22 1345  TempSrc:   PainSc: 0-No pain                 [name],[name]

## 2022-02-14 ENCOUNTER — Encounter (HOSPITAL_COMMUNITY): Payer: Self-pay | Admitting: Orthopedic Surgery

## 2022-03-12 ENCOUNTER — Other Ambulatory Visit: Payer: Self-pay | Admitting: Family Medicine

## 2022-03-12 NOTE — Telephone Encounter (Signed)
Last OV-09/26/21 Last refill-06/25/21-60 tabs, 5 refills  No future OV scheduled.

## 2022-06-16 ENCOUNTER — Other Ambulatory Visit: Payer: Self-pay | Admitting: Family Medicine

## 2022-07-16 ENCOUNTER — Encounter: Payer: Self-pay | Admitting: Family Medicine

## 2022-07-17 ENCOUNTER — Other Ambulatory Visit: Payer: Self-pay

## 2022-07-23 ENCOUNTER — Encounter: Payer: Self-pay | Admitting: Family Medicine

## 2022-07-23 ENCOUNTER — Ambulatory Visit: Payer: 59 | Admitting: Family Medicine

## 2022-07-23 ENCOUNTER — Encounter: Payer: Self-pay | Admitting: Neurology

## 2022-07-23 ENCOUNTER — Other Ambulatory Visit: Payer: Self-pay

## 2022-07-23 VITALS — BP 120/80 | HR 59 | Temp 98.1°F | Wt 185.0 lb

## 2022-07-23 DIAGNOSIS — R200 Anesthesia of skin: Secondary | ICD-10-CM | POA: Diagnosis not present

## 2022-07-23 DIAGNOSIS — G5601 Carpal tunnel syndrome, right upper limb: Secondary | ICD-10-CM | POA: Diagnosis not present

## 2022-07-23 DIAGNOSIS — R202 Paresthesia of skin: Secondary | ICD-10-CM

## 2022-07-23 NOTE — Progress Notes (Signed)
   Subjective:    Patient ID: Ricky Freeman, male    DOB: 09-20-1960, 62 y.o.   MRN: [id number]  HPI Here for 4 months of slowly progressive numbness and tingling in the right hand. This primarily involves the thumb and 2nd and 3rd fingers. He has some weakness when he uses the hand for long periods of time. There is no pain. Most of his job work is on a [employer].    Review of Systems  Constitutional: Negative.   Respiratory: Negative.    Cardiovascular: Negative.   Neurological:  Positive for numbness.       Objective:   Physical Exam Constitutional:      Appearance: Normal appearance.  Cardiovascular:     Rate and Rhythm: Normal rate and regular rhythm.     Pulses: Normal pulses.     Heart sounds: Normal heart sounds.  Pulmonary:     Effort: Pulmonary effort is normal.     Breath sounds: Normal breath sounds.  Musculoskeletal:     Comments: Right hand appears normal. It is warm and pink. Capillary refills are normal.   Neurological:     Mental Status: He is alert.           Assessment & Plan:  He likely has carpal tunnel syndrome on the right hand. He will rest it as much as possible and wear a wirst brace when he is at home. Set up a NCV/EMG soon.  Gershon Crane, MD

## 2022-08-01 ENCOUNTER — Ambulatory Visit: Payer: 59 | Admitting: Neurology

## 2022-08-01 DIAGNOSIS — R202 Paresthesia of skin: Secondary | ICD-10-CM

## 2022-08-01 DIAGNOSIS — G5601 Carpal tunnel syndrome, right upper limb: Secondary | ICD-10-CM

## 2022-08-01 NOTE — Addendum Note (Signed)
Addended by: Gershon Crane A on: 08/01/2022 12:04 PM   Modules accepted: Orders

## 2022-08-01 NOTE — Procedures (Signed)
  [facility]  [address] [place], [address]  [place], [place] [address] Tel: [phone] Fax: [phone] Test Date:  08/01/2022  Patient: Ricky Freeman DOB: Jul 19, 1960 Physician: Nita Sickle, DO  Sex: Male Height: 6\' 0"  Ref Phys: Gershon Crane, MD  ID#: [id number]   Technician:    History: This is a 62 year old man referred for evaluation of right hand numbness and tingling.  NCV & EMG Findings: Extensive electrodiagnostic testing of the right upper extremity shows:  Right median sensory response is absent.  Right ulnar sensory responses within normal limits. Right median motor response shows severely prolonged latency (7.7 ms) and reduced amplitude (1.0 mV); of note, there is evidence of a right Martin-Gruber anastomosis, a normal anatomic variant.  Right ulnar motor responses within normal limits.  Chronic motor axon loss changes are seen affecting the right abductor pollicis brevis muscle, without accompanying active denervation.    Impression: Right median neuropathy at or distal to the wrist, consistent with a clinical diagnosis of carpal tunnel syndrome.  Overall, these findings are severe in degree electrically. There is no evidence of a cervical radiculopathy affecting the right upper extremity.   ___________________________ Nita Sickle, DO    Nerve Conduction Studies   Stim Site NR Peak (ms) Norm Peak (ms) O-P Amp (V) Norm O-P Amp  Right Median Anti Sensory (2nd Digit)  32 C  Wrist *NR  <3.8  >10  Right Ulnar Anti Sensory (5th Digit)  32 C  Wrist    3.2 <3.2 8.9 >5     Stim Site NR Onset (ms) Norm Onset (ms) O-P Amp (mV) Norm O-P Amp Site1 Site2 Delta-0 (ms) Dist (cm) Vel (m/s) Norm Vel (m/s)  Right Median Motor (Abd Poll Brev)  32 C  Wrist    *7.7 <4.0 *1.0 >5 Elbow Wrist 3.7 34.0 92 >50  Elbow    11.4  4.2  Ulnar wrist-crossover Elbow 7.3 0.0    Ulnar wrist-crossover    4.1  4.4         Right Ulnar Motor (Abd Dig Minimi)  32 C  Wrist    2.3 <3.1  9.7 >7 B Elbow Wrist 4.2 22.0 52 >50  B Elbow    6.5  9.0  A Elbow B Elbow 1.9 10.0 53 >50  A Elbow    8.4  8.6          Electromyography   Side Muscle Ins.Act Fibs Fasc Recrt Amp Dur Poly Activation Comment  Right 1stDorInt Nml Nml Nml Nml Nml Nml Nml Nml N/A  Right Abd Poll Brev Nml Nml Nml *2- *1+ *1+ *1+ Nml N/A  Right PronatorTeres Nml Nml Nml Nml Nml Nml Nml Nml N/A  Right Biceps Nml Nml Nml Nml Nml Nml Nml Nml N/A  Right Triceps Nml Nml Nml Nml Nml Nml Nml Nml N/A  Right Deltoid Nml Nml Nml Nml Nml Nml Nml Nml N/A      Waveforms:

## 2022-08-14 HISTORY — PX: CARPAL TUNNEL RELEASE: SHX101

## 2022-10-01 ENCOUNTER — Encounter: Payer: Self-pay | Admitting: Family Medicine

## 2022-10-01 ENCOUNTER — Ambulatory Visit (INDEPENDENT_AMBULATORY_CARE_PROVIDER_SITE_OTHER): Payer: 59 | Admitting: Family Medicine

## 2022-10-01 VITALS — BP 112/80 | HR 60 | Temp 98.4°F | Ht 71.25 in | Wt 184.4 lb

## 2022-10-01 DIAGNOSIS — Z0000 Encounter for general adult medical examination without abnormal findings: Secondary | ICD-10-CM | POA: Diagnosis not present

## 2022-10-01 DIAGNOSIS — R7989 Other specified abnormal findings of blood chemistry: Secondary | ICD-10-CM | POA: Diagnosis not present

## 2022-10-01 LAB — CBC WITH DIFFERENTIAL/PLATELET
Basophils Absolute: 0.1 10*3/uL (ref 0.0–0.1)
Basophils Relative: 1.1 % (ref 0.0–3.0)
Eosinophils Absolute: 0.1 10*3/uL (ref 0.0–0.7)
Eosinophils Relative: 1.7 % (ref 0.0–5.0)
HCT: 41.7 % (ref 39.0–52.0)
Hemoglobin: 13.7 g/dL (ref 13.0–17.0)
Lymphocytes Relative: 37.4 % (ref 12.0–46.0)
Lymphs Abs: 1.7 10*3/uL (ref 0.7–4.0)
MCHC: 33 g/dL (ref 30.0–36.0)
MCV: 80 fl (ref 78.0–100.0)
Monocytes Absolute: 0.6 10*3/uL (ref 0.1–1.0)
Monocytes Relative: 12.2 % — ABNORMAL HIGH (ref 3.0–12.0)
Neutro Abs: 2.2 10*3/uL (ref 1.4–7.7)
Neutrophils Relative %: 47.6 % (ref 43.0–77.0)
Platelets: 259 10*3/uL (ref 150.0–400.0)
RBC: 5.21 Mil/uL (ref 4.22–5.81)
RDW: 19.1 % — ABNORMAL HIGH (ref 11.5–15.5)
WBC: 4.5 10*3/uL (ref 4.0–10.5)

## 2022-10-01 LAB — PSA: PSA: 3.81 ng/mL (ref 0.10–4.00)

## 2022-10-01 LAB — BASIC METABOLIC PANEL
BUN: 15 mg/dL (ref 6–23)
CO2: 29 mEq/L (ref 19–32)
Calcium: 9.6 mg/dL (ref 8.4–10.5)
Chloride: 103 mEq/L (ref 96–112)
Creatinine, Ser: 1.17 mg/dL (ref 0.40–1.50)
GFR: 67.92 mL/min (ref >60.00)
Glucose, Bld: 108 mg/dL — ABNORMAL HIGH (ref 70–99)
Potassium: 4.7 mEq/L (ref 3.5–5.1)
Sodium: 139 mEq/L (ref 135–145)

## 2022-10-01 LAB — LIPID PANEL
Cholesterol: 152 mg/dL (ref 0–200)
HDL: 47.9 mg/dL (ref >39.00)
LDL Cholesterol: 86 mg/dL (ref 0–99)
NonHDL: 104.34
Total CHOL/HDL Ratio: 3
Triglycerides: 91 mg/dL (ref 0.0–149.0)
VLDL: 18.2 mg/dL (ref 0.0–40.0)

## 2022-10-01 LAB — HEPATIC FUNCTION PANEL
ALT: 17 U/L (ref 0–53)
AST: 20 U/L (ref 0–37)
Albumin: 4.2 g/dL (ref 3.5–5.2)
Alkaline Phosphatase: 93 U/L (ref 39–117)
Bilirubin, Direct: 0.2 mg/dL (ref 0.0–0.3)
Total Bilirubin: 1.4 mg/dL — ABNORMAL HIGH (ref 0.2–1.2)
Total Protein: 6.6 g/dL (ref 6.0–8.3)

## 2022-10-01 LAB — TSH: TSH: 1.71 u[IU]/mL (ref 0.35–5.50)

## 2022-10-01 LAB — HEMOGLOBIN A1C: Hgb A1c MFr Bld: 6.1 % (ref 4.6–6.5)

## 2022-10-01 NOTE — Progress Notes (Signed)
Subjective:    Patient ID: Ricky Freeman, male    DOB: 17-Sep-1960, 62 y.o.   MRN: [id number]  HPI Here for a well exam. He feels great. He had a carpal tunnel release on 08-14-22, and this was successful although he has some residual numbness in the right 3rd finger. Hopefully this will resolve in the next few months. He also has a trigger finger in the right 3rd finger, and he had a tendon sheath injection on 09-25-22. He still works out in [employer] or runs every day.    Review of Systems  Constitutional: Negative.   HENT: Negative.    Eyes: Negative.   Respiratory: Negative.    Cardiovascular: Negative.   Gastrointestinal: Negative.   Genitourinary: Negative.   Musculoskeletal: Negative.   Skin: Negative.   Neurological: Negative.   Psychiatric/Behavioral: Negative.         Objective:   Physical Exam Constitutional:      General: He is not in acute distress.    Appearance: Normal appearance. He is well-developed. He is not diaphoretic.  HENT:     Head: Normocephalic and atraumatic.     Right Ear: External ear normal.     Left Ear: External ear normal.     Nose: Nose normal.     Mouth/Throat:     Pharynx: No oropharyngeal exudate.  Eyes:     General: No scleral icterus.       Right eye: No discharge.        Left eye: No discharge.     Conjunctiva/sclera: Conjunctivae normal.     Pupils: Pupils are equal, round, and reactive to light.  Neck:     Thyroid: No thyromegaly.     Vascular: No JVD.     Trachea: No tracheal deviation.  Cardiovascular:     Rate and Rhythm: Normal rate and regular rhythm.     Pulses: Normal pulses.     Heart sounds: Normal heart sounds. No murmur heard.    No friction rub. No gallop.  Pulmonary:     Effort: Pulmonary effort is normal. No respiratory distress.     Breath sounds: Normal breath sounds. No wheezing or rales.  Chest:     Chest wall: No tenderness.  Abdominal:     General: Bowel sounds are normal. There is no distension.      Palpations: Abdomen is soft. There is no mass.     Tenderness: There is no abdominal tenderness. There is no guarding or rebound.  Genitourinary:    Penis: Normal. No tenderness.      Testes: Normal.     Prostate: Normal.     Rectum: Normal. Guaiac result negative.  Musculoskeletal:        General: No tenderness. Normal range of motion.     Cervical back: Neck supple.  Lymphadenopathy:     Cervical: No cervical adenopathy.  Skin:    General: Skin is warm and dry.     Coloration: Skin is not pale.     Findings: No erythema or rash.  Neurological:     Mental Status: He is alert and oriented to person, place, and time.     Cranial Nerves: No cranial nerve deficit.     Motor: No abnormal muscle tone.     Coordination: Coordination normal.     Deep Tendon Reflexes: Reflexes are normal and symmetric. Reflexes normal.  Psychiatric:        Mood and Affect: Mood normal.  Behavior: Behavior normal.        Thought Content: Thought content normal.        Judgment: Judgment normal.           Assessment & Plan:  Well exam. We discussed diet and exercise. Get fasting labs. Gershon Crane, MD

## 2022-10-10 ENCOUNTER — Other Ambulatory Visit: Payer: Self-pay

## 2022-10-12 ENCOUNTER — Other Ambulatory Visit: Payer: Self-pay | Admitting: Family Medicine

## 2022-10-13 NOTE — Addendum Note (Signed)
Addended by: Gershon Crane A on: 10/13/2022 08:03 AM   Modules accepted: Orders

## 2022-11-10 ENCOUNTER — Other Ambulatory Visit (INDEPENDENT_AMBULATORY_CARE_PROVIDER_SITE_OTHER): Payer: 59

## 2022-11-10 DIAGNOSIS — R7989 Other specified abnormal findings of blood chemistry: Secondary | ICD-10-CM | POA: Diagnosis not present

## 2022-11-10 LAB — CBC WITH DIFFERENTIAL/PLATELET
Basophils Absolute: 0.1 10*3/uL (ref 0.0–0.1)
Basophils Relative: 1.2 % (ref 0.0–3.0)
Eosinophils Absolute: 0.1 10*3/uL (ref 0.0–0.7)
Eosinophils Relative: 2.3 % (ref 0.0–5.0)
HCT: 41 % (ref 39.0–52.0)
Hemoglobin: 13.6 g/dL (ref 13.0–17.0)
Lymphocytes Relative: 36.4 % (ref 12.0–46.0)
Lymphs Abs: 1.7 10*3/uL (ref 0.7–4.0)
MCHC: 33.1 g/dL (ref 30.0–36.0)
MCV: 83.1 fl (ref 78.0–100.0)
Monocytes Absolute: 0.6 10*3/uL (ref 0.1–1.0)
Monocytes Relative: 12.2 % — ABNORMAL HIGH (ref 3.0–12.0)
Neutro Abs: 2.2 10*3/uL (ref 1.4–7.7)
Neutrophils Relative %: 47.9 % (ref 43.0–77.0)
Platelets: 269 10*3/uL (ref 150.0–400.0)
RBC: 4.93 Mil/uL (ref 4.22–5.81)
RDW: 17.2 % — ABNORMAL HIGH (ref 11.5–15.5)
WBC: 4.6 10*3/uL (ref 4.0–10.5)

## 2022-12-04 ENCOUNTER — Encounter: Payer: Self-pay | Admitting: Family Medicine

## 2023-01-20 ENCOUNTER — Emergency Department (HOSPITAL_BASED_OUTPATIENT_CLINIC_OR_DEPARTMENT_OTHER): Payer: 59 | Admitting: Radiology

## 2023-01-20 ENCOUNTER — Encounter (HOSPITAL_BASED_OUTPATIENT_CLINIC_OR_DEPARTMENT_OTHER): Payer: Self-pay

## 2023-01-20 ENCOUNTER — Other Ambulatory Visit: Payer: Self-pay

## 2023-01-20 ENCOUNTER — Emergency Department (HOSPITAL_BASED_OUTPATIENT_CLINIC_OR_DEPARTMENT_OTHER)
Admission: EM | Admit: 2023-01-20 | Discharge: 2023-01-20 | Disposition: A | Discharge status: Home / Self Care | Payer: 59 | Attending: Emergency Medicine | Admitting: Emergency Medicine

## 2023-01-20 DIAGNOSIS — Z85828 Personal history of other malignant neoplasm of skin: Secondary | ICD-10-CM | POA: Insufficient documentation

## 2023-01-20 DIAGNOSIS — Z96612 Presence of left artificial shoulder joint: Secondary | ICD-10-CM | POA: Insufficient documentation

## 2023-01-20 DIAGNOSIS — R079 Chest pain, unspecified: Secondary | ICD-10-CM | POA: Insufficient documentation

## 2023-01-20 DIAGNOSIS — Z96611 Presence of right artificial shoulder joint: Secondary | ICD-10-CM | POA: Insufficient documentation

## 2023-01-20 LAB — CBC
HCT: 41 % (ref 39.0–52.0)
Hemoglobin: 14.4 g/dL (ref 13.0–17.0)
MCH: 29.4 pg (ref 26.0–34.0)
MCHC: 35.1 g/dL (ref 30.0–36.0)
MCV: 83.8 fL (ref 80.0–100.0)
Platelets: 249 10*3/uL (ref 150–400)
RBC: 4.89 MIL/uL (ref 4.22–5.81)
RDW: 13.9 % (ref 11.5–15.5)
WBC: 4.9 10*3/uL (ref 4.0–10.5)
nRBC: 0 % (ref 0.0–0.2)

## 2023-01-20 LAB — TROPONIN I (HIGH SENSITIVITY)
Troponin I (High Sensitivity): 6 ng/L (ref <18)
Troponin I (High Sensitivity): 5 ng/L (ref <18)

## 2023-01-20 LAB — BASIC METABOLIC PANEL
Anion gap: 6 (ref 5–15)
BUN: 14 mg/dL (ref 6–20)
CO2: 28 mmol/L (ref 22–32)
Calcium: 9.6 mg/dL (ref 8.9–10.3)
Chloride: 103 mmol/L (ref 98–111)
Creatinine, Ser: 1.19 mg/dL (ref 0.61–1.24)
GFR, Estimated: >60 mL/min (ref >60)
Glucose, Bld: 122 mg/dL — ABNORMAL HIGH (ref 70–99)
Potassium: 4.1 mmol/L (ref 3.5–5.1)
Sodium: 137 mmol/L (ref 135–145)

## 2023-01-20 MED ORDER — HYDRALAZINE HCL 25 MG PO TABS
25.0000 mg | ORAL_TABLET | Freq: Once | ORAL | Status: AC
  Administered 2023-01-20: 25 mg via ORAL
  Filled 2023-01-20: qty 1

## 2023-01-20 NOTE — ED Triage Notes (Signed)
Patient presents to ED c/o chest pain that started this morning while walking downstairs to get coffee. Patient reports that for 2-3 weeks he has been short of breath and reports there are "occasions" when he has to gasp for air. Patient reports taking ASA prior to coming to ED.

## 2023-01-20 NOTE — Discharge Instructions (Signed)
You were seen for chest pain.  No evidence of heart attack was seen on your heart enzymes.  You have a lbbb on EKG. Please recheck with your doctor this week.  Return to the ED if any worsening symptoms or new problems

## 2023-01-20 NOTE — ED Provider Notes (Signed)
[facility]GENCY [facility] Emergency Department Provider Note MRN:  [id number]  Arrival date & time: 01/20/23     Chief Complaint   Chest Pain   History of Present Illness   Ricky Freeman is a 62 y.o. year-old male with a history of bundle branch block presenting to the ED with chief complaint of chest pain.  Chest pain this morning while walking down the stairs to get some coffee.  Intermittently for the past few weeks has been noting occasions where he has to take a big breath.  Currently denies any chest pain or shortness of breath, feeling some lightheadedness.  Review of Systems  A thorough review of systems was obtained and all systems are negative except as noted in the HPI and PMH.   Patient's Health History    Past Medical History:  Diagnosis Date   Arthritis    "joints" (03/26/2017)   Basal cell carcinoma 2000s   "burned off my chest"   Impingement syndrome of left shoulder 11/2015   Left bundle branch block (LBBB) 2015   Coronary CTA with calcium score of 0 and normal coronary anatomy   Rotator cuff strain 11/2015   left    Past Surgical History:  Procedure Laterality Date   CARPAL TUNNEL RELEASE Right 08/14/2022   per Dr. Kelli Hope at [facility]   COLONOSCOPY WITH PROPOFOL  01/07/2021   per Dr. Rhea Belton, adenomatous polyps, repeat in 7 yrs   JOINT REPLACEMENT     LASIK Bilateral    SHOULDER ARTHROSCOPY WITH ROTATOR CUFF REPAIR Left 11/2015   SHOULDER OPEN ROTATOR CUFF REPAIR Left 1992   Dr. Eulah Pont   TONSILLECTOMY     TOTAL HIP ARTHROPLASTY Right 02/13/2022   Procedure: TOTAL HIP ARTHROPLASTY ANTERIOR APPROACH;  Surgeon: Samson Frederic, MD;  Location: [facility];  Service: Orthopedics;  Laterality: Right;  150   TOTAL SHOULDER ARTHROPLASTY Left 03/26/2017   Procedure: TOTAL SHOULDER ARTHROPLASTY;  Surgeon: Francena Hanly, MD;  Location: [facility] OR;  Service: Orthopedics;  Laterality: Left;   TOTAL SHOULDER ARTHROPLASTY Right 11/26/2017    Procedure: RIGHT TOTAL SHOULDER ARTHROPLASTY;  Surgeon: Francena Hanly, MD;  Location: [facility] OR;  Service: Orthopedics;  Laterality: Right;   TOTAL SHOULDER REPLACEMENT Left 03/26/2017   VARICOCELECTOMY Bilateral 09/14/2001   WISDOM TOOTH EXTRACTION      Family History  Problem Relation Age of Onset   Colon cancer Mother 48       stage I colon ca with part colectomy   Prostate cancer Father    Esophageal cancer Neg Hx    Stomach cancer Neg Hx     Social History   Socioeconomic History   Marital status: Divorced    Spouse name: Not on file   Number of children: Not on file   Years of education: Not on file   Highest education level: Bachelor's degree (e.g., BA, AB, BS)  Occupational History   Not on file  Tobacco Use   Smoking status: Never   Smokeless tobacco: Never  Vaping Use   Vaping status: Never Used  Substance and Sexual Activity   Alcohol use: No   Drug use: No   Sexual activity: Not on file  Other Topics Concern   Not on file  Social History Narrative   Not on file   Social Determinants of Health   Financial Resource Strain: Not on file  Food Insecurity: No Food Insecurity (07/22/2022)   Hunger Vital Sign    Worried About Running Out of  Food in the Last Year: Never true    Ran Out of Food in the Last Year: Never true  Transportation Needs: No Transportation Needs (07/22/2022)   PRAPARE - Administrator, Civil Service (Medical): No    Lack of Transportation (Non-Medical): No  Physical Activity: Sufficiently Active (07/22/2022)   Exercise Vital Sign    Days of Exercise per Week: 7 days    Minutes of Exercise per Session: 120 min  Stress: No Stress Concern Present (07/22/2022)   [employer] of Occupational Health - Occupational Stress Questionnaire    Feeling of Stress : Not at all  Social Connections: Socially Integrated (07/22/2022)   Social Connection and Isolation Panel [NHANES]    Frequency of Communication with Friends and Family: Twice a week     Frequency of Social Gatherings with Friends and Family: Three times a week    Attends Religious Services: 1 to 4 times per year    Active Member of Clubs or Organizations: Yes    Attends Banker Meetings: 1 to 4 times per year    Marital Status: Living with partner  Intimate Partner Violence: Not on file     Physical Exam   Vitals:   01/20/23 0607 01/20/23 0623  BP:  (!) 163/86  Pulse:    Resp:    Temp: 97.9 F (36.6 C)   SpO2:      CONSTITUTIONAL: Well-appearing, NAD NEURO/PSYCH:  Alert and oriented x 3, no focal deficits EYES:  eyes equal and reactive ENT/NECK:  no LAD, no JVD CARDIO: Regular rate, well-perfused, normal S1 and S2 PULM:  CTAB no wheezing or rhonchi GI/GU:  non-distended, non-tender MSK/SPINE:  No gross deformities, no edema SKIN:  no rash, atraumatic   *Additional and/or pertinent findings included in MDM below  Diagnostic and Interventional Summary    EKG Interpretation Date/Time:  Tuesday January 20 2023 06:05:56 EST Ventricular Rate:  54 PR Interval:  167 QRS Duration:  159 QT Interval:  427 QTC Calculation: 405 R Axis:   66  Text Interpretation: Sinus rhythm Left bundle branch block Confirmed by Kennis Carina [phone]) on 01/20/2023 6:16:39 AM       Labs Reviewed  CBC  BASIC METABOLIC PANEL  TROPONIN I (HIGH SENSITIVITY)    DG Chest 2 View    (Results Pending)    Medications  hydrALAZINE (APRESOLINE) tablet 25 mg (25 mg Oral Given 01/20/23 3875)     Procedures  /  Critical Care Procedures  ED Course and Medical Decision Making  Initial Impression and Ddx Differential diagnosis includes ACS, pulmonary edema, pneumothorax, overall highly doubt PE.  No evidence of DVT, no tachycardia, no hypoxia, no symptoms currently.  Past medical/surgical history that increases complexity of ED encounter: History of left bundle branch block  Interpretation of Diagnostics I personally reviewed the EKG and my interpretation is as  follows: Sinus rhythm with left bundle branch block, no significant change from prior  Labs reassuring with no significant blood count or electrolyte disturbance.  First troponin negative.  Patient Reassessment and Ultimate Disposition/Management     Awaiting second troponin.  Heart score is 3, patient has symptom-free at this time.  Suspect would be appropriate for discharge with cardiology follow-up if second troponin is negative.  Signed out to oncoming provider at shift change.  Patient management required discussion with the following services or consulting groups:  None  Complexity of Problems Addressed Acute illness or injury that poses threat of life of bodily function  Additional Data Reviewed and Analyzed Further history obtained from: Further history from spouse/family member  Additional Factors Impacting ED Encounter Risk None  Elmer Sow. Pilar Plate, MD [facility] Emergency Medicine [facility] [email or web address]  Final Clinical Impressions(s) / ED Diagnoses     ICD-10-CM   1. Chest pain, unspecified type  R07.9       ED Discharge Orders     None        Discharge Instructions Discussed with and Provided to Patient:   Discharge Instructions   None      Sabas Sous, MD 01/20/23 [phone]

## 2023-01-20 NOTE — ED Provider Notes (Signed)
  Physical Exam  BP (!) 153/89   Pulse (!) 56   Temp 97.9 F (36.6 C) (Oral)   Resp 14   Ht 1.829 m (6')   Wt 88.7 kg   SpO2 100%   BMI 26.52 kg/m   Physical Exam  Procedures  Procedures  ED Course / MDM    Medical Decision Making Amount and/or Complexity of Data Reviewed Labs: ordered. Radiology: ordered.  Risk Prescription drug management.   62 yo male ho lbbb presents with recent urge to take deep breath, some burning chest pain with walking down steps at 430 in am. Low suspicion story per Dr.Bero Heart score 3 Pain free here Plan d/c if second trop negative.   SEcond trop negative Discussed results with patient and wife Vies regarding return to [place] and need for follow-up and voices understanding    Margarita Grizzle, MD 01/20/23 [phone]

## 2023-01-21 ENCOUNTER — Encounter: Payer: Self-pay | Admitting: Family Medicine

## 2023-01-26 NOTE — Telephone Encounter (Signed)
The EKG was normal except for something called left bundle branch block. This has been present on his EKG's for the past 10 years. This is nothing to worry about but we will keep an eye on it

## 2023-02-13 ENCOUNTER — Other Ambulatory Visit: Payer: Self-pay | Admitting: Family Medicine

## 2023-02-18 ENCOUNTER — Other Ambulatory Visit: Payer: Self-pay | Admitting: Family Medicine

## 2023-05-04 ENCOUNTER — Other Ambulatory Visit: Payer: Self-pay | Admitting: Family Medicine

## 2023-06-02 ENCOUNTER — Telehealth: Payer: Self-pay | Admitting: Family Medicine

## 2023-06-02 ENCOUNTER — Other Ambulatory Visit: Payer: Self-pay | Admitting: Family Medicine

## 2023-06-02 NOTE — Telephone Encounter (Signed)
 Error

## 2023-10-12 ENCOUNTER — Encounter: Payer: Self-pay | Admitting: Family Medicine

## 2023-10-12 ENCOUNTER — Ambulatory Visit (INDEPENDENT_AMBULATORY_CARE_PROVIDER_SITE_OTHER): Admitting: Family Medicine

## 2023-10-12 VITALS — BP 98/62 | HR 56 | Temp 98.1°F | Ht 71.5 in | Wt 181.0 lb

## 2023-10-12 DIAGNOSIS — Z0000 Encounter for general adult medical examination without abnormal findings: Secondary | ICD-10-CM | POA: Diagnosis not present

## 2023-10-12 DIAGNOSIS — Z131 Encounter for screening for diabetes mellitus: Secondary | ICD-10-CM

## 2023-10-12 DIAGNOSIS — Z23 Encounter for immunization: Secondary | ICD-10-CM

## 2023-10-12 DIAGNOSIS — Z13220 Encounter for screening for lipoid disorders: Secondary | ICD-10-CM

## 2023-10-12 DIAGNOSIS — Z125 Encounter for screening for malignant neoplasm of prostate: Secondary | ICD-10-CM | POA: Diagnosis not present

## 2023-10-12 LAB — HEPATIC FUNCTION PANEL
ALT: 22 U/L (ref 0–53)
AST: 21 U/L (ref 0–37)
Albumin: 4.3 g/dL (ref 3.5–5.2)
Alkaline Phosphatase: 76 U/L (ref 39–117)
Bilirubin, Direct: 0.1 mg/dL (ref 0.0–0.3)
Total Bilirubin: 0.9 mg/dL (ref 0.2–1.2)
Total Protein: 6.6 g/dL (ref 6.0–8.3)

## 2023-10-12 LAB — CBC WITH DIFFERENTIAL/PLATELET
Basophils Absolute: 0.1 K/uL (ref 0.0–0.1)
Basophils Relative: 1.5 % (ref 0.0–3.0)
Eosinophils Absolute: 0.2 K/uL (ref 0.0–0.7)
Eosinophils Relative: 3.2 % (ref 0.0–5.0)
HCT: 41.4 % (ref 39.0–52.0)
Hemoglobin: 13.8 g/dL (ref 13.0–17.0)
Lymphocytes Relative: 34 % (ref 12.0–46.0)
Lymphs Abs: 1.8 K/uL (ref 0.7–4.0)
MCHC: 33.2 g/dL (ref 30.0–36.0)
MCV: 84.5 fl (ref 78.0–100.0)
Monocytes Absolute: 0.7 K/uL (ref 0.1–1.0)
Monocytes Relative: 13.1 % — ABNORMAL HIGH (ref 3.0–12.0)
Neutro Abs: 2.5 K/uL (ref 1.4–7.7)
Neutrophils Relative %: 48.2 % (ref 43.0–77.0)
Platelets: 270 K/uL (ref 150.0–400.0)
RBC: 4.9 Mil/uL (ref 4.22–5.81)
RDW: 15.3 % (ref 11.5–15.5)
WBC: 5.2 K/uL (ref 4.0–10.5)

## 2023-10-12 LAB — BASIC METABOLIC PANEL WITH GFR
BUN: 13 mg/dL (ref 6–23)
CO2: 31 meq/L (ref 19–32)
Calcium: 9.3 mg/dL (ref 8.4–10.5)
Chloride: 101 meq/L (ref 96–112)
Creatinine, Ser: 1.16 mg/dL (ref 0.40–1.50)
GFR: 68.13 mL/min (ref >60.00)
Glucose, Bld: 96 mg/dL (ref 70–99)
Potassium: 4.6 meq/L (ref 3.5–5.1)
Sodium: 139 meq/L (ref 135–145)

## 2023-10-12 LAB — LIPID PANEL
Cholesterol: 147 mg/dL (ref 0–200)
HDL: 41.5 mg/dL (ref >39.00)
LDL Cholesterol: 65 mg/dL (ref 0–99)
NonHDL: 105.76
Total CHOL/HDL Ratio: 4
Triglycerides: 204 mg/dL — ABNORMAL HIGH (ref 0.0–149.0)
VLDL: 40.8 mg/dL — ABNORMAL HIGH (ref 0.0–40.0)

## 2023-10-12 LAB — HEMOGLOBIN A1C: Hgb A1c MFr Bld: 6 % (ref 4.6–6.5)

## 2023-10-12 MED ORDER — METHYLPREDNISOLONE 4 MG PO TBPK: ORAL_TABLET | ORAL | 0 refills | Status: AC

## 2023-10-12 NOTE — Progress Notes (Signed)
 Subjective:    Patient ID: Ricky Freeman, male    DOB: Sep 25, 1960, 63 y.o.   MRN: [id number]  HPI Here for a well exam. He has been doing well except for an itchy rash on his trunk and legs that appeared 2 weeks ago. He had a televisit and was prescribed a steroid taper pack. This helped but he asks if he can have one more to take the rash away.    Review of Systems  Constitutional: Negative.   HENT: Negative.    Eyes: Negative.   Respiratory: Negative.    Cardiovascular: Negative.   Gastrointestinal: Negative.   Genitourinary: Negative.   Musculoskeletal: Negative.   Skin:  Positive for rash.  Neurological: Negative.   Psychiatric/Behavioral: Negative.         Objective:   Physical Exam Constitutional:      General: He is not in acute distress.    Appearance: Normal appearance. He is well-developed. He is not diaphoretic.  HENT:     Head: Normocephalic and atraumatic.     Right Ear: External ear normal.     Left Ear: External ear normal.     Nose: Nose normal.     Mouth/Throat:     Pharynx: No oropharyngeal exudate.  Eyes:     General: No scleral icterus.       Right eye: No discharge.        Left eye: No discharge.     Conjunctiva/sclera: Conjunctivae normal.     Pupils: Pupils are equal, round, and reactive to light.  Neck:     Thyroid : No thyromegaly.     Vascular: No JVD.     Trachea: No tracheal deviation.  Cardiovascular:     Rate and Rhythm: Normal rate and regular rhythm.     Pulses: Normal pulses.     Heart sounds: Normal heart sounds. No murmur heard.    No friction rub. No gallop.  Pulmonary:     Effort: Pulmonary effort is normal. No respiratory distress.     Breath sounds: Normal breath sounds. No wheezing or rales.  Chest:     Chest wall: No tenderness.  Abdominal:     General: Bowel sounds are normal. There is no distension.     Palpations: Abdomen is soft. There is no mass.     Tenderness: There is no abdominal tenderness. There is no  guarding or rebound.  Genitourinary:    Penis: Normal. No tenderness.      Testes: Normal.     Prostate: Normal.     Rectum: Normal. Guaiac result negative.  Musculoskeletal:        General: No tenderness. Normal range of motion.     Cervical back: Neck supple.  Lymphadenopathy:     Cervical: No cervical adenopathy.  Skin:    General: Skin is warm and dry.     Comments: Widespread red papular rash on the trunk and the legs   Neurological:     General: No focal deficit present.     Mental Status: He is alert and oriented to person, place, and time.     Cranial Nerves: No cranial nerve deficit.     Motor: No abnormal muscle tone.     Coordination: Coordination normal.     Deep Tendon Reflexes: Reflexes are normal and symmetric. Reflexes normal.  Psychiatric:        Mood and Affect: Mood normal.        Behavior: Behavior normal.  Thought Content: Thought content normal.        Judgment: Judgment normal.           Assessment & Plan:  Well exam. We discussed diet and exercise. Get fasting labs. He is given another Medrol  dose pack for the rash. Garnette Olmsted, MD

## 2023-10-13 LAB — PSA: PSA: 2.07 ng/mL (ref 0.10–4.00)

## 2023-10-13 LAB — TSH: TSH: 1.13 u[IU]/mL (ref 0.35–5.50)

## 2023-10-15 ENCOUNTER — Ambulatory Visit: Payer: Self-pay | Admitting: Family Medicine

## 2023-10-22 ENCOUNTER — Telehealth: Payer: Self-pay | Admitting: *Deleted

## 2023-10-22 NOTE — Telephone Encounter (Signed)
 Copied from CRM #[id number]. Topic: Clinical - Medical Advice >> Oct 20, 2023 11:13 AM [place] E wrote: Reason for CRM: Patient would like to know the ramifications of being vaccinated twice for pneumonia. Callback number [phone].

## 2023-10-22 NOTE — Telephone Encounter (Signed)
 There is no harm at all for getting more than one pneumonia shot, but per current guidelines he will now not need any more

## 2023-10-22 NOTE — Telephone Encounter (Signed)
 See my answer to his other question

## 2023-10-22 NOTE — Telephone Encounter (Signed)
 Patient is aware

## 2023-10-23 NOTE — Telephone Encounter (Signed)
This encounter has been completed °

## 2024-02-22 ENCOUNTER — Other Ambulatory Visit: Payer: Self-pay | Admitting: Medical Genetics
